# Patient Record
Sex: Female | Born: 1945 | Race: White | Hispanic: No | Marital: Married | Smoking: Never smoker
Health system: Southern US, Community
[De-identification: ages and names within clinical notes are randomized; demographics above are authoritative.]

## PROBLEM LIST (undated history)

## (undated) DIAGNOSIS — N390 Urinary tract infection, site not specified: Secondary | ICD-10-CM

## (undated) DIAGNOSIS — K565 Intestinal adhesions [bands], unspecified as to partial versus complete obstruction: Secondary | ICD-10-CM

## (undated) DIAGNOSIS — F329 Major depressive disorder, single episode, unspecified: Secondary | ICD-10-CM

## (undated) DIAGNOSIS — K5909 Other constipation: Secondary | ICD-10-CM

## (undated) DIAGNOSIS — E785 Hyperlipidemia, unspecified: Secondary | ICD-10-CM

## (undated) DIAGNOSIS — E039 Hypothyroidism, unspecified: Secondary | ICD-10-CM

## (undated) DIAGNOSIS — C20 Malignant neoplasm of rectum: Secondary | ICD-10-CM

## (undated) DIAGNOSIS — Z780 Asymptomatic menopausal state: Secondary | ICD-10-CM

## (undated) DIAGNOSIS — F32A Depression, unspecified: Secondary | ICD-10-CM

## (undated) DIAGNOSIS — R197 Diarrhea, unspecified: Secondary | ICD-10-CM

## (undated) HISTORY — DX: Diarrhea, unspecified: R19.7

## (undated) HISTORY — DX: Malignant neoplasm of rectum: C20

## (undated) HISTORY — DX: Hypothyroidism, unspecified: E03.9

## (undated) HISTORY — DX: Other constipation: K59.09

## (undated) HISTORY — DX: Hyperlipidemia, unspecified: E78.5

## (undated) HISTORY — DX: Urinary tract infection, site not specified: N39.0

## (undated) HISTORY — DX: Intestinal adhesions (bands), unspecified as to partial versus complete obstruction: K56.50

## (undated) HISTORY — DX: Asymptomatic menopausal state: Z78.0

## (undated) HISTORY — DX: Major depressive disorder, single episode, unspecified: F32.9

## (undated) HISTORY — PX: APPENDECTOMY: SHX54

## (undated) HISTORY — PX: BACK SURGERY: SHX140

## (undated) HISTORY — DX: Depression, unspecified: F32.A

## (undated) HISTORY — PX: THYROIDECTOMY: SHX17

---

## 1967-05-21 HISTORY — PX: TUBAL LIGATION: SHX77

## 1992-05-20 HISTORY — PX: OTHER SURGICAL HISTORY: SHX169

## 2005-05-20 HISTORY — PX: RECTAL SURGERY: SHX760

## 2005-05-20 HISTORY — PX: COLON SURGERY: SHX602

## 2012-01-16 ENCOUNTER — Encounter: Payer: Self-pay | Admitting: Internal Medicine

## 2012-01-16 DIAGNOSIS — E039 Hypothyroidism, unspecified: Secondary | ICD-10-CM

## 2012-01-16 DIAGNOSIS — C20 Malignant neoplasm of rectum: Secondary | ICD-10-CM

## 2012-12-18 HISTORY — PX: COLONOSCOPY: SHX174

## 2013-03-26 ENCOUNTER — Ambulatory Visit (INDEPENDENT_AMBULATORY_CARE_PROVIDER_SITE_OTHER): Payer: Medicare Other | Admitting: Internal Medicine

## 2013-03-26 ENCOUNTER — Telehealth (INDEPENDENT_AMBULATORY_CARE_PROVIDER_SITE_OTHER): Payer: Self-pay | Admitting: *Deleted

## 2013-03-26 ENCOUNTER — Encounter (INDEPENDENT_AMBULATORY_CARE_PROVIDER_SITE_OTHER): Payer: Self-pay | Admitting: Internal Medicine

## 2013-03-26 VITALS — BP 140/70 | HR 72 | Temp 98.6°F | Resp 18 | Ht 65.0 in | Wt 158.5 lb

## 2013-03-26 DIAGNOSIS — K5900 Constipation, unspecified: Secondary | ICD-10-CM

## 2013-03-26 DIAGNOSIS — Z85038 Personal history of other malignant neoplasm of large intestine: Secondary | ICD-10-CM

## 2013-03-26 DIAGNOSIS — I10 Essential (primary) hypertension: Secondary | ICD-10-CM | POA: Insufficient documentation

## 2013-03-26 DIAGNOSIS — E782 Mixed hyperlipidemia: Secondary | ICD-10-CM | POA: Insufficient documentation

## 2013-03-26 DIAGNOSIS — E785 Hyperlipidemia, unspecified: Secondary | ICD-10-CM

## 2013-03-26 DIAGNOSIS — K5909 Other constipation: Secondary | ICD-10-CM

## 2013-03-26 DIAGNOSIS — F32A Depression, unspecified: Secondary | ICD-10-CM | POA: Insufficient documentation

## 2013-03-26 DIAGNOSIS — F329 Major depressive disorder, single episode, unspecified: Secondary | ICD-10-CM

## 2013-03-26 DIAGNOSIS — E039 Hypothyroidism, unspecified: Secondary | ICD-10-CM | POA: Insufficient documentation

## 2013-03-26 DIAGNOSIS — Z8719 Personal history of other diseases of the digestive system: Secondary | ICD-10-CM

## 2013-03-26 LAB — TSH: TSH: 0.29 u[IU]/mL — ABNORMAL LOW (ref 0.350–4.500)

## 2013-03-26 LAB — CBC
MCH: 31.9 pg (ref 26.0–34.0)
MCV: 92.7 fL (ref 78.0–100.0)
Platelets: 246 10*3/uL (ref 150–400)
RBC: 4.23 MIL/uL (ref 3.87–5.11)

## 2013-03-26 LAB — COMPREHENSIVE METABOLIC PANEL
ALT: 16 U/L (ref 0–35)
AST: 18 U/L (ref 0–37)
Alkaline Phosphatase: 54 U/L (ref 39–117)
Glucose, Bld: 92 mg/dL (ref 70–99)
Potassium: 4.4 mEq/L (ref 3.5–5.3)
Sodium: 135 mEq/L (ref 135–145)
Total Bilirubin: 0.7 mg/dL (ref 0.3–1.2)
Total Protein: 6.7 g/dL (ref 6.0–8.3)

## 2013-03-26 MED ORDER — LUBIPROSTONE 8 MCG PO CAPS: 16.0000 ug | ORAL_CAPSULE | Freq: Two times a day (BID) | ORAL | Status: DC

## 2013-03-26 NOTE — Patient Instructions (Signed)
CT enterography to be scheduled. Physician will contact her with results of blood work and CT when completed. Gradually increase intake of fiber each foods. Can use Dulcolax suppository or fleets enema daily or every other day as discussed. Keep food intake and stool diary until next office visit

## 2013-03-26 NOTE — Telephone Encounter (Signed)
Per Dr.Rehman the patient will need to have labs drawn. 

## 2013-03-26 NOTE — Progress Notes (Signed)
Referring Provider:Dr. Earma Reading, MD Primary Care Physician:  Toma Deiters, MD   Reason for consultation; Worsening constipation and recent hospitalization for small bowel obstruction. HPI:   Brandi Neal is 67 year old Caucasian female who is referred through courtesy of Dr. Scotty Court for GI evaluation. Brandi Neal is accompanied by her husband. Brandi Neal states that she has had constipation all of her life. When she had surgery for colon carcinoma in 2007 she was advised to take MiraLax every day so that she would not become constipated. She had good results until this year when this medication stopped working. She doubled up on the dose and off for a while had good results. A few months ago MiraLax at twice daily schedule quit working. He added women's laxative and this combination worked for a while and finally she had to add fleets enema to [employer] and laxative before she would get any results. On 02/18/2013 she developed mid abdominal pain which is progressive leading to ER visit on 02/19/2013 at [facility] in [facility]. She was found to have small bowel obstruction. She was admitted to Dr. Bartholomew Crews service and treated with NG suction IV fluids with resolution of her small bowel obstruction. While hospitalized she was seen by Dr. Cristy Folks and felt to have obstruction secondary to adhesions. One day after discharge she developed abdominal pain and was taken to the emergency room at [facility] advised to take for laxatives daily. She did not feel comfortable with this recommendation and on followup with Dr. Quincy Simmonds she was begun on Amitiza. She has no results if she takes 24 mcg daily and when she takes it twice daily she has explosive diarrhea and feels miserable. She believes she is getting enough fiber in her diet. She is very concerned about her symptoms because her sister died of small bowel obstruction. She wants to make sure she does not have stricture and small bowel obstruction would  relapse. She denies recent weight loss. She does not feel well and has no energy. She denies heartburn nausea vomiting melena or rectal bleeding. She had colonoscopy by Dr. Caren Macadam in August this year and no abnormality was found. Brandi Neal states that her thyroid dose has not been changed for several years.  She states she is fairly active and and walks regularly.  Current Outpatient Prescriptions  Medication Sig Dispense Refill  . Calcium Carbonate-Vitamin D (CALCIUM 600+D3) 600-400 MG-UNIT per tablet Take 1 tablet by mouth 2 (two) times daily.      Marland Kitchen FLUoxetine (PROZAC) 40 MG capsule Take 40 mg by mouth daily.      . Levothyroxine Sodium (SYNTHROID PO) Take 0.075 mg by mouth daily.      Marland Kitchen lubiprostone (AMITIZA) 8 MCG capsule Take 2 capsules (16 mcg total) by mouth 2 (two) times daily.  120 capsule  3  . Multiple Vitamins-Minerals (MULTI FOR HER 50+ PO) Take by mouth daily.      . Omega-3 Fatty Acids (FISH OIL) 1200 MG CAPS Take by mouth daily.      . simvastatin (ZOCOR) 40 MG tablet Take 20 mg by mouth daily.      Marland Kitchen tretinoin (RETIN-A) 0.1 % cream Apply topically at bedtime as needed.       . valsartan-hydrochlorothiazide (DIOVAN-HCT) 160-12.5 MG per tablet Take 1 tablet by mouth daily.       No current facility-administered medications for this visit.    Past medical history; Tubal ligation in 1969 with incidental appendectomy. Thyroid surgery for goiter in 1983 and she has been  on replacement therapy ever since. Hypertension of 20 years duration. Hyperlipidemia for more than 10 years. History of depression' now in remission. She had lumbar spine surgery for disc disease in 1987 and again in 1994. She has chronic low back pain. Hysterectomy for excessive bleeding in 1994. 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 She had low anterior resection in January 2007 at [facility] long-winded ileostomy which was reversed 3 months later. No residual tumor  was found in the resected specimen and she did not require adjuvant therapy.  Allergies; NKA.  Family history; Father died of lung carcinoma at age 36. Mother died of renal disease at age 66. One sister was treated for cervical cancer at age 1 with radiation and went on to developed rectal carcinoma at 63 and died at 34 of bowel obstruction. She has 2 brothers and 2 sisters living. One brother and one sister has diabetes mellitus.  Social history; She is married. She is retired from [employer] where she worked for 29 years.  her daughter was diabetic and died of MI at age 40. He has 2 grownup sons and one has Crohn's disease but is in remission. She has never smoked cigarettes and drinks alcohol occasionally.  Review of Systems: See HPI, otherwise normal ROS  Physical Exam: BP 140/70  Pulse 72  Temp(Src) 98.6 F (37 C) (Oral)  Resp 18  Ht 5\' 5"  (1.651 m)  Wt 158 lb 8 oz (71.895 kg)  BMI 26.38 kg/m2 Less than well-developed well-nourished Caucasian female who is in NAD. Conjunctiva is pink. Sclerae nonicteric. Oropharyngeal mucosa is normal. No neck masses or thyromegaly noted. Cardiac exam with a regular rhythm normal S1 and S2. No murmur or gallop noted. Lungs are clear to auscultation. Abdomen is symmetrical. Bowel sounds are normal. Abdomen is soft and nontender without organomegaly or masses. Rectal examination reveals normal sphincter tone. She has soft formed stool in the rectum which is guaiac negative. No peripheral edema or clubbing noted.  Lab data; Abdominopelvic CT was reviewed with help of Dr. Ulyses Southward. This study was performed at [facility] on 02/19/2013. He has at least 4 small hepatic cysts and dilated loops of small bowel with a transition zone cyst in the diagnosis of small bowel obstruction. No adenopathy or ascites noted.  CBC from 02-2013. WBC 16.1 H&H 14.2 and 42.7 and platelet count 388K  CBC from 02/20/2013 WBC 6.9 H&H 10.4 and 30.8 and  platelet count 162K  Chemistry panel from 02/20/2013 Serum sodium 139, potassium 3.7, carotid 109, CO2 24, BUN 75, nightly 16 creatinine 0.55 and calcium 7.2. Bilirubin 1.2, a peanut 39, AST 14, ALT 8, total protein 5.8 and albumin 3.1.     Assessment; Brandi Neal has the following GI issues #1. Chronic constipation which has been progressive and not responding well to therapy. She unfortunately has become laxative dependent and it is a matter of finding the right combination which would work and would not result in explosive diarrhea. Will check TSH to make sure she is on appropriate dose. Since she is getting results with Amitiza will try to adjust dose and see if it works otherwise will consider switching her to Linzess. #2. Recent hospitalization for small bowel obstruction. Suspect this was secondary to adhesions but need to rule out small bowel stricture before adding bulk to her diet. Unless constipation is treated effectively she is at risk for recurrent small bowel obstruction. #3. History of colorectal carcinoma. She is in remission  and up-to-date on surveillance colonoscopy. Will request recent colonoscopy records for review. #4. Anemia. Hemoglobin was low at the time of his charge one month ago. Stool is guaiac negative and anemia may have been secondary to acute illness.  Recommendations; Brandi Neal advised to gradually increase intake of fiber rich foods. Decrease Amitiza to 16 mcg by mouth twice a day. Brandi Neal will go to the lab for CBC, comprehensive chemistry panel and TSH level. Will schedule CT enterography and if this is not possible will proceed with small bowel follow-through. Brandi Neal advised to use Dulcolax suppository or fleets enema on an as-needed basis. She can use it daily if she needs to. She will keep a record of food intake and stool diary until her next visit in 4 weeks.  We appreciate the opportunity to participate in the care of this nice lady.

## 2013-03-29 ENCOUNTER — Other Ambulatory Visit (INDEPENDENT_AMBULATORY_CARE_PROVIDER_SITE_OTHER): Payer: Self-pay | Admitting: Internal Medicine

## 2013-03-29 ENCOUNTER — Telehealth (INDEPENDENT_AMBULATORY_CARE_PROVIDER_SITE_OTHER): Payer: Self-pay | Admitting: *Deleted

## 2013-03-29 ENCOUNTER — Encounter (INDEPENDENT_AMBULATORY_CARE_PROVIDER_SITE_OTHER): Payer: Self-pay

## 2013-03-29 DIAGNOSIS — K56609 Unspecified intestinal obstruction, unspecified as to partial versus complete obstruction: Secondary | ICD-10-CM

## 2013-03-29 DIAGNOSIS — K5900 Constipation, unspecified: Secondary | ICD-10-CM

## 2013-03-29 DIAGNOSIS — Z85038 Personal history of other malignant neoplasm of large intestine: Secondary | ICD-10-CM

## 2013-03-29 NOTE — Telephone Encounter (Signed)
Patient seems to be very aggravated in her general conversation with me. She is wanting specific answer to questions and not more.

## 2013-03-29 NOTE — Telephone Encounter (Signed)
Would like to know what is she anemic in? Return her call at [phone].

## 2013-03-30 NOTE — Telephone Encounter (Signed)
I have talked with the patient this morning  per Dr.Rehman's instruction. She was advised that her lab work was normal except for her TSH , was low and that she needed to have the physician that over seen this to be made aware.

## 2013-03-31 ENCOUNTER — Ambulatory Visit (HOSPITAL_COMMUNITY)
Admission: RE | Admit: 2013-03-31 | Discharge: 2013-03-31 | Disposition: A | Discharge status: Home / Self Care | Payer: Medicare Other | Source: Ambulatory Visit | Attending: Internal Medicine | Admitting: Internal Medicine

## 2013-03-31 DIAGNOSIS — K7689 Other specified diseases of liver: Secondary | ICD-10-CM | POA: Insufficient documentation

## 2013-03-31 DIAGNOSIS — K56609 Unspecified intestinal obstruction, unspecified as to partial versus complete obstruction: Secondary | ICD-10-CM

## 2013-03-31 DIAGNOSIS — K429 Umbilical hernia without obstruction or gangrene: Secondary | ICD-10-CM | POA: Insufficient documentation

## 2013-03-31 DIAGNOSIS — K5900 Constipation, unspecified: Secondary | ICD-10-CM | POA: Insufficient documentation

## 2013-03-31 DIAGNOSIS — Z85038 Personal history of other malignant neoplasm of large intestine: Secondary | ICD-10-CM

## 2013-03-31 DIAGNOSIS — Z800 Family history of malignant neoplasm of digestive organs: Secondary | ICD-10-CM | POA: Insufficient documentation

## 2013-03-31 MED ORDER — IOHEXOL 300 MG/ML  SOLN
100.0000 mL | Freq: Once | INTRAMUSCULAR | Status: AC | PRN
  Administered 2013-03-31: 100 mL via INTRAVENOUS

## 2013-04-26 ENCOUNTER — Ambulatory Visit (INDEPENDENT_AMBULATORY_CARE_PROVIDER_SITE_OTHER): Payer: Medicare Other | Admitting: Internal Medicine

## 2013-05-11 ENCOUNTER — Encounter (INDEPENDENT_AMBULATORY_CARE_PROVIDER_SITE_OTHER): Payer: Self-pay

## 2015-07-08 IMAGING — CT CT ENTEROGRAPHY (ABD-PELV W/ CM)
2 of 5 series · 17 of 46 positions shown, 19 images · IV contrast (omnipaque)
Comparison: Ultrasound 03/11/2013.  CT scan [DATE].

CLINICAL DATA: History: Colon cancer. Constipation. Prior rectal
surgery. Appendectomy. Hysterectomy

EXAM:
CT ABDOMEN AND PELVIS WITH CONTRAST (ENTEROGRAPHY)
TECHNIQUE: Multidetector CT of the abdomen and pelvis during bolus
administration of intravenous contrast. Negative oral contrast
VoLumen was given.
CONTRAST:  100mL OMNIPAQUE IOHEXOL 300 MG/ML  SOLN

[Series 3: abd_pel 2.0 b40f · axial · 0.73mm/px · z∈[-439,-47]mm · 14 of 220 slices shown, 16 images]
[im 12/220  soft-tissue]
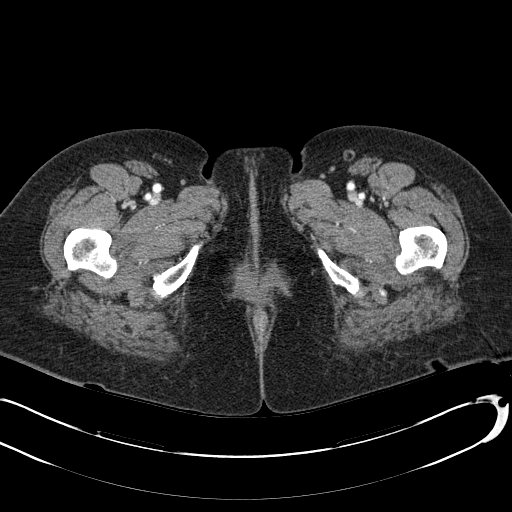
[im 12/220  bone]
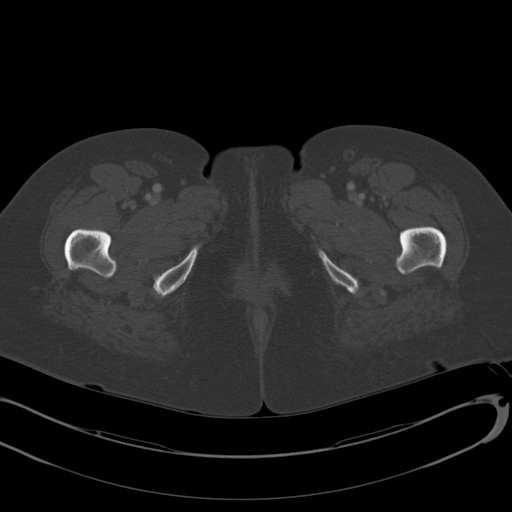
[im 24/220  soft-tissue]
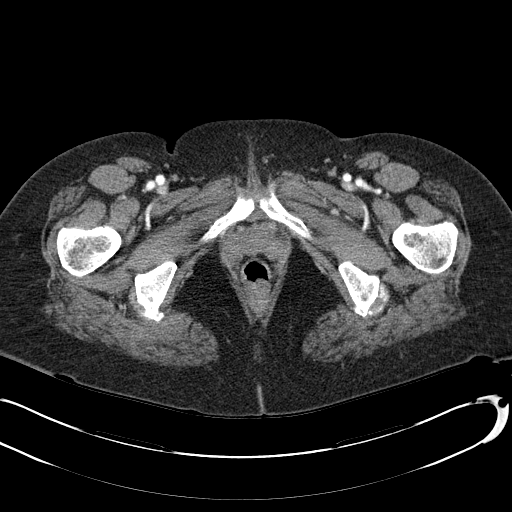
[im 47/220  soft-tissue]
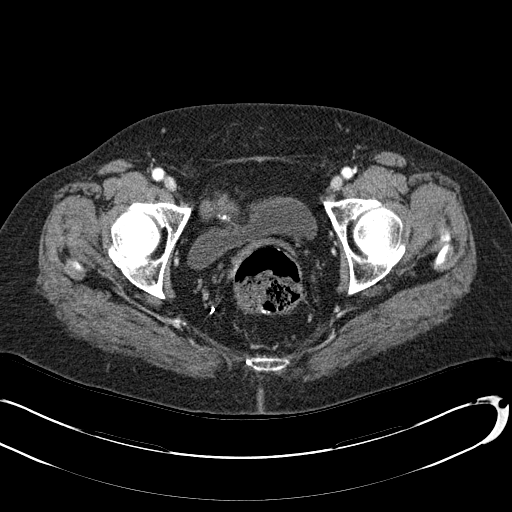
[im 58/220  soft-tissue]
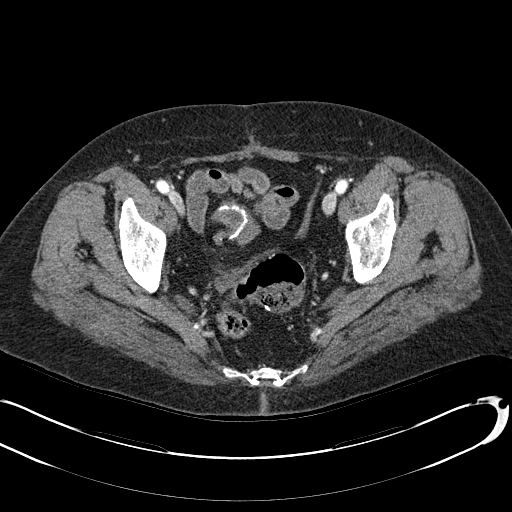
[im 70/220  soft-tissue]
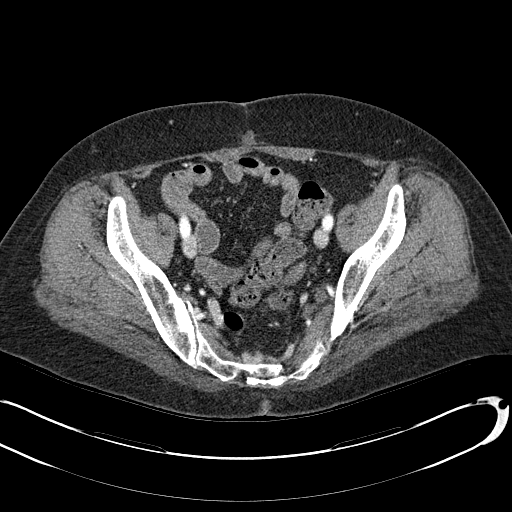
[im 93/220  soft-tissue]
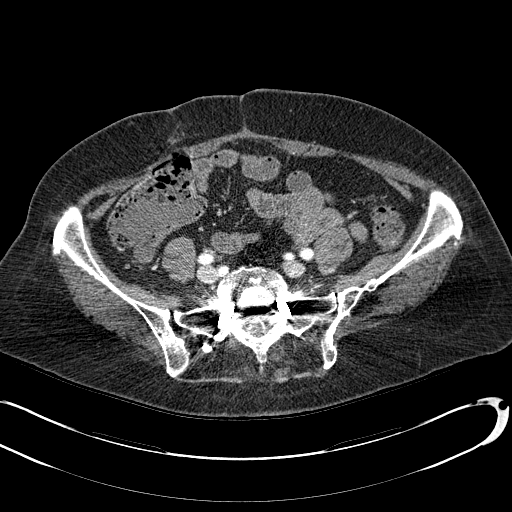
[im 104/220  soft-tissue]
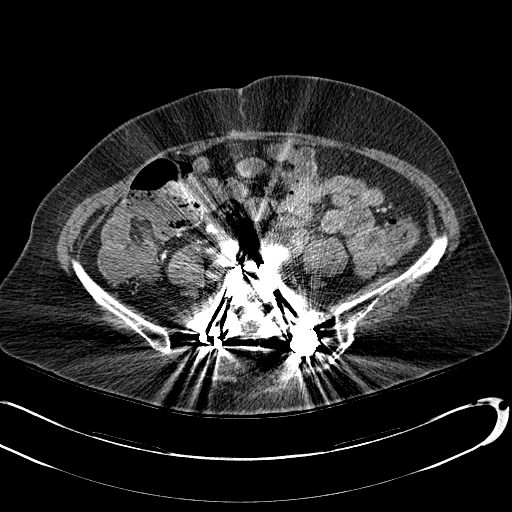
[im 116/220  soft-tissue]
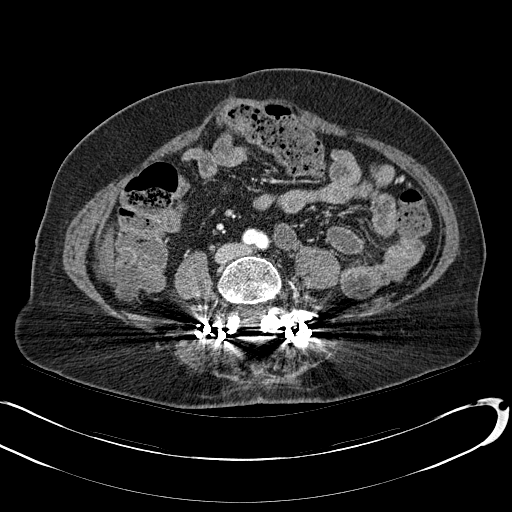
[im 127/220  soft-tissue]
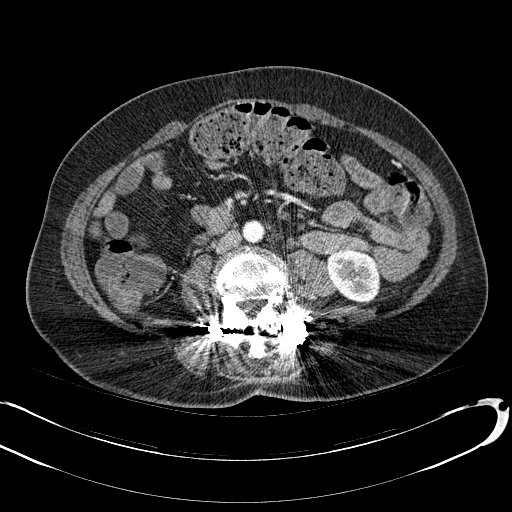
[im 127/220  bone]
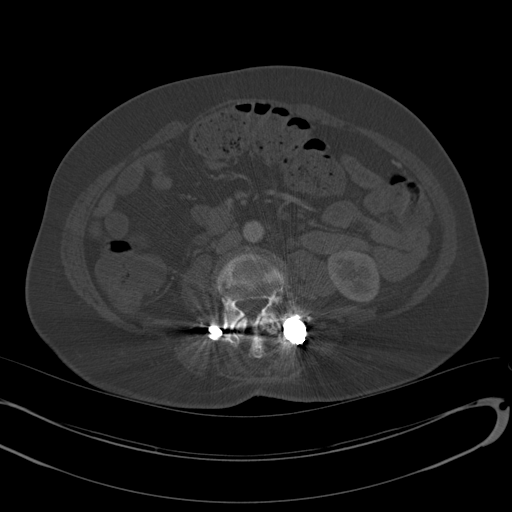
[im 150/220  soft-tissue]
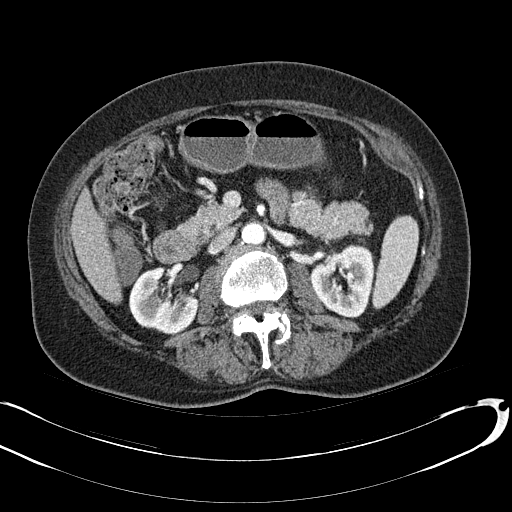
[im 162/220  soft-tissue]
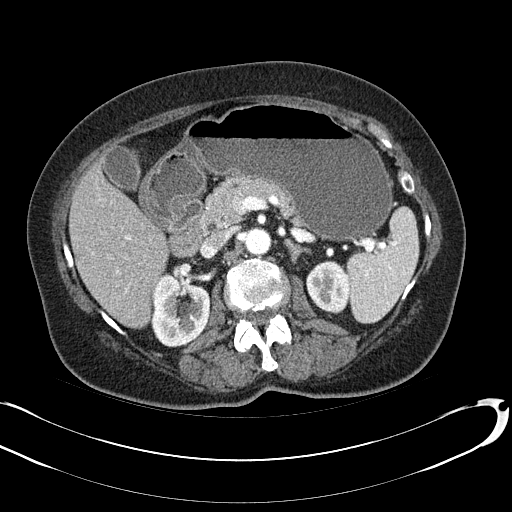
[im 173/220  soft-tissue]
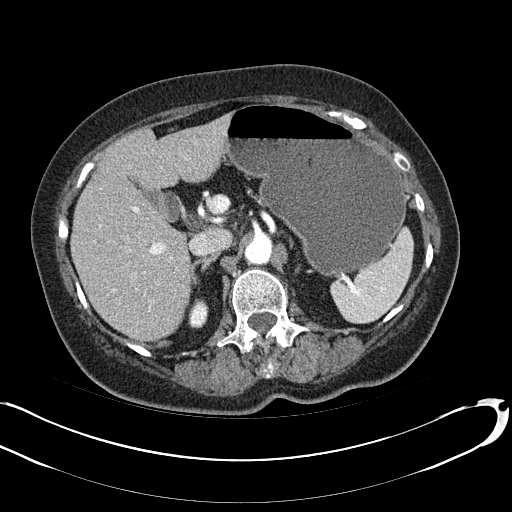
[im 196/220  soft-tissue]
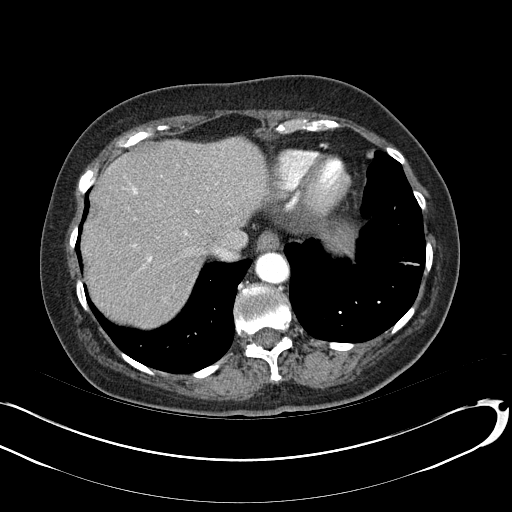
[im 208/220  soft-tissue]
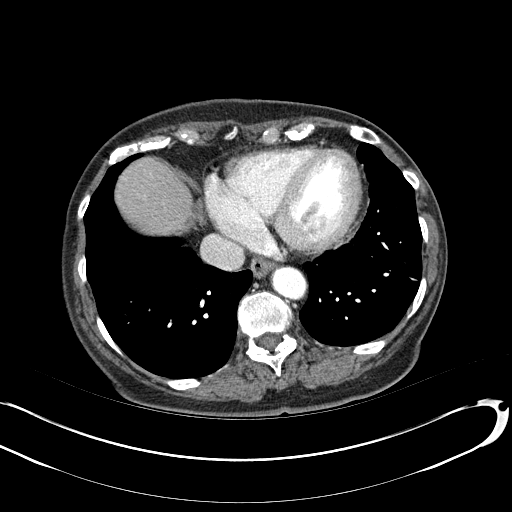

[Series 5: mpr cor post contrast (id) · coronal · 0.89mm/px · 3 of 78 slices shown]
[im 26/78  soft-tissue]
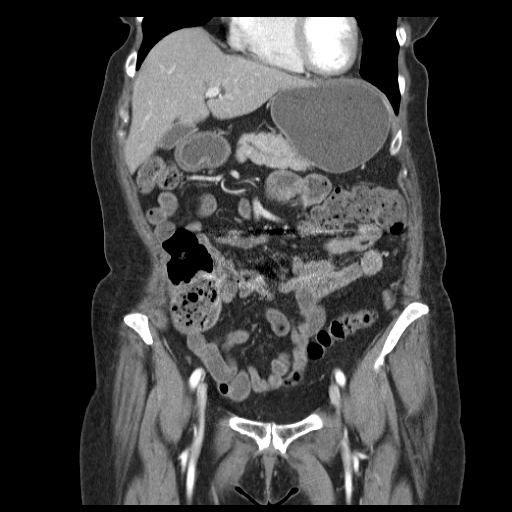
[im 35/78  soft-tissue]
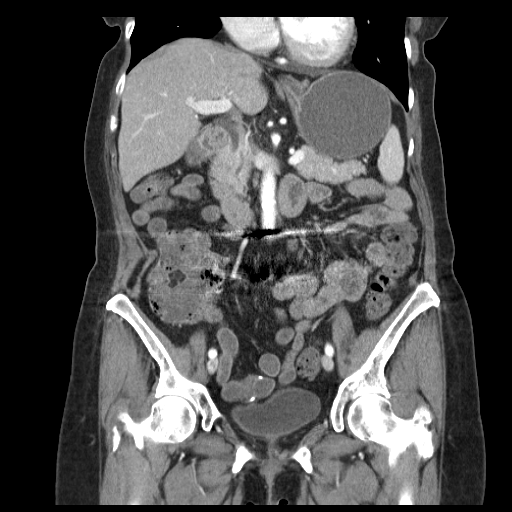
[im 43/78  soft-tissue]
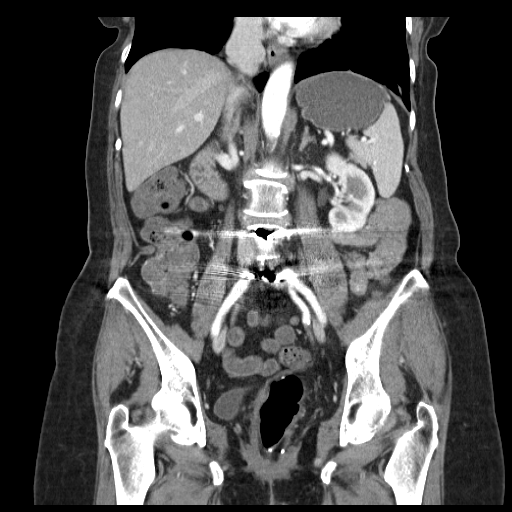

[17 of 46 positions shown; findings below may reference images not displayed]

FINDINGS: Small hepatic cysts again noted. Spleen normal. Pancreas normal. No
biliary distention. Gallbladder is nondistended.

Adrenals normal. Kidneys normal. No hydronephrosis or evidence of
obstructing ureteral stone. Bladder is not distended. Phleboliths.
Hysterectomy. Adnexal regions are normal.

No adenopathy.  Aorta normal caliber.  Aortic branch vessels patent.

No inflammatory changes noted right or left lower quadrant. Patient
has had prior rectosigmoid colon surgery. The patient has had prior
small bowel surgery. Anastomotic sutures are noted. Large amount of
stool is noted in the colon consistent with constipation. No
prominent bowel distention noted. The stomach is slightly distended.
Previously identified small bowel obstruction is no longer present.
Umbilical hernia with herniation of bowel small bowel noted. There
is no evidence of obstruction or strangulation. No free air.

Lung bases clear. Heart size normal. Prior lumbosacral spine fusion.
No acute osseous abnormality.
IMPRESSION: 1. Interval resolution of small bowel obstruction. Postsurgical
change noted lower small bowel region as well as the rectosigmoid
region. Umbilical hernia with herniation of a small amount of small
bowel is again noted. There is no evidence of bowel obstruction or
strangulation.
2. Multiple small hepatic cysts.
3. Constipation.

## 2021-10-17 ENCOUNTER — Inpatient Hospital Stay (HOSPITAL_COMMUNITY)
Admission: EM | Admit: 2021-10-17 | Discharge: 2021-10-20 | DRG: 395 | Disposition: A | Discharge status: Home / Self Care | Payer: Medicare Other | Attending: Internal Medicine | Admitting: Internal Medicine

## 2021-10-17 ENCOUNTER — Encounter (HOSPITAL_COMMUNITY): Payer: Self-pay

## 2021-10-17 ENCOUNTER — Other Ambulatory Visit: Payer: Self-pay

## 2021-10-17 DIAGNOSIS — K625 Hemorrhage of anus and rectum: Secondary | ICD-10-CM

## 2021-10-17 DIAGNOSIS — Z801 Family history of malignant neoplasm of trachea, bronchus and lung: Secondary | ICD-10-CM

## 2021-10-17 DIAGNOSIS — K559 Vascular disorder of intestine, unspecified: Principal | ICD-10-CM | POA: Diagnosis present

## 2021-10-17 DIAGNOSIS — Z800 Family history of malignant neoplasm of digestive organs: Secondary | ICD-10-CM

## 2021-10-17 DIAGNOSIS — I480 Paroxysmal atrial fibrillation: Secondary | ICD-10-CM | POA: Diagnosis not present

## 2021-10-17 DIAGNOSIS — Z79890 Hormone replacement therapy (postmenopausal): Secondary | ICD-10-CM

## 2021-10-17 DIAGNOSIS — Z85038 Personal history of other malignant neoplasm of large intestine: Secondary | ICD-10-CM | POA: Diagnosis present

## 2021-10-17 DIAGNOSIS — Z85048 Personal history of other malignant neoplasm of rectum, rectosigmoid junction, and anus: Secondary | ICD-10-CM

## 2021-10-17 DIAGNOSIS — K529 Noninfective gastroenteritis and colitis, unspecified: Secondary | ICD-10-CM | POA: Diagnosis not present

## 2021-10-17 DIAGNOSIS — I10 Essential (primary) hypertension: Secondary | ICD-10-CM

## 2021-10-17 DIAGNOSIS — Z79899 Other long term (current) drug therapy: Secondary | ICD-10-CM

## 2021-10-17 DIAGNOSIS — K8689 Other specified diseases of pancreas: Secondary | ICD-10-CM | POA: Diagnosis present

## 2021-10-17 DIAGNOSIS — Z8249 Family history of ischemic heart disease and other diseases of the circulatory system: Secondary | ICD-10-CM

## 2021-10-17 DIAGNOSIS — R9431 Abnormal electrocardiogram [ECG] [EKG]: Secondary | ICD-10-CM

## 2021-10-17 DIAGNOSIS — E039 Hypothyroidism, unspecified: Secondary | ICD-10-CM | POA: Diagnosis present

## 2021-10-17 DIAGNOSIS — Z841 Family history of disorders of kidney and ureter: Secondary | ICD-10-CM

## 2021-10-17 DIAGNOSIS — I129 Hypertensive chronic kidney disease with stage 1 through stage 4 chronic kidney disease, or unspecified chronic kidney disease: Secondary | ICD-10-CM | POA: Diagnosis present

## 2021-10-17 DIAGNOSIS — E782 Mixed hyperlipidemia: Secondary | ICD-10-CM | POA: Diagnosis present

## 2021-10-17 DIAGNOSIS — N1831 Chronic kidney disease, stage 3a: Secondary | ICD-10-CM | POA: Diagnosis present

## 2021-10-17 DIAGNOSIS — Z8261 Family history of arthritis: Secondary | ICD-10-CM

## 2021-10-17 DIAGNOSIS — Z833 Family history of diabetes mellitus: Secondary | ICD-10-CM

## 2021-10-17 NOTE — ED Triage Notes (Signed)
Pt presents with rectal bleeding from a hemorrhoid that pt has had for years. Pt states that it started bleeding a few days ago but that the bleeding increased today. Pt is changing her brief every hour. Endorses abdominal pain as well.

## 2021-10-17 NOTE — ED Notes (Signed)
Ambulatory to tx room. Pt did not want to undress until edp is in room.

## 2021-10-18 ENCOUNTER — Emergency Department (HOSPITAL_COMMUNITY): Payer: Medicare Other

## 2021-10-18 ENCOUNTER — Encounter (HOSPITAL_COMMUNITY): Payer: Self-pay | Admitting: Internal Medicine

## 2021-10-18 DIAGNOSIS — E039 Hypothyroidism, unspecified: Secondary | ICD-10-CM | POA: Diagnosis present

## 2021-10-18 DIAGNOSIS — I480 Paroxysmal atrial fibrillation: Secondary | ICD-10-CM

## 2021-10-18 DIAGNOSIS — R9431 Abnormal electrocardiogram [ECG] [EKG]: Secondary | ICD-10-CM

## 2021-10-18 DIAGNOSIS — I10 Essential (primary) hypertension: Secondary | ICD-10-CM

## 2021-10-18 DIAGNOSIS — Z79899 Other long term (current) drug therapy: Secondary | ICD-10-CM | POA: Diagnosis not present

## 2021-10-18 DIAGNOSIS — Z85048 Personal history of other malignant neoplasm of rectum, rectosigmoid junction, and anus: Secondary | ICD-10-CM | POA: Diagnosis not present

## 2021-10-18 DIAGNOSIS — K625 Hemorrhage of anus and rectum: Secondary | ICD-10-CM

## 2021-10-18 DIAGNOSIS — E782 Mixed hyperlipidemia: Secondary | ICD-10-CM | POA: Diagnosis present

## 2021-10-18 DIAGNOSIS — K529 Noninfective gastroenteritis and colitis, unspecified: Secondary | ICD-10-CM

## 2021-10-18 DIAGNOSIS — Z841 Family history of disorders of kidney and ureter: Secondary | ICD-10-CM | POA: Diagnosis not present

## 2021-10-18 DIAGNOSIS — K621 Rectal polyp: Secondary | ICD-10-CM | POA: Diagnosis not present

## 2021-10-18 DIAGNOSIS — Z8249 Family history of ischemic heart disease and other diseases of the circulatory system: Secondary | ICD-10-CM | POA: Diagnosis not present

## 2021-10-18 DIAGNOSIS — Z801 Family history of malignant neoplasm of trachea, bronchus and lung: Secondary | ICD-10-CM | POA: Diagnosis not present

## 2021-10-18 DIAGNOSIS — N1831 Chronic kidney disease, stage 3a: Secondary | ICD-10-CM

## 2021-10-18 DIAGNOSIS — Z800 Family history of malignant neoplasm of digestive organs: Secondary | ICD-10-CM | POA: Diagnosis not present

## 2021-10-18 DIAGNOSIS — Z8261 Family history of arthritis: Secondary | ICD-10-CM | POA: Diagnosis not present

## 2021-10-18 DIAGNOSIS — K6389 Other specified diseases of intestine: Secondary | ICD-10-CM | POA: Diagnosis not present

## 2021-10-18 DIAGNOSIS — I129 Hypertensive chronic kidney disease with stage 1 through stage 4 chronic kidney disease, or unspecified chronic kidney disease: Secondary | ICD-10-CM | POA: Diagnosis present

## 2021-10-18 DIAGNOSIS — Z833 Family history of diabetes mellitus: Secondary | ICD-10-CM | POA: Diagnosis not present

## 2021-10-18 DIAGNOSIS — K559 Vascular disorder of intestine, unspecified: Secondary | ICD-10-CM | POA: Diagnosis present

## 2021-10-18 DIAGNOSIS — K8689 Other specified diseases of pancreas: Secondary | ICD-10-CM | POA: Diagnosis present

## 2021-10-18 DIAGNOSIS — Z79890 Hormone replacement therapy (postmenopausal): Secondary | ICD-10-CM | POA: Diagnosis not present

## 2021-10-18 LAB — COMPREHENSIVE METABOLIC PANEL
ALT: 16 U/L (ref 0–44)
AST: 19 U/L (ref 15–41)
Albumin: 4.1 g/dL (ref 3.5–5.0)
Alkaline Phosphatase: 56 U/L (ref 38–126)
Anion gap: 7 (ref 5–15)
BUN: 20 mg/dL (ref 8–23)
CO2: 19 mmol/L — ABNORMAL LOW (ref 22–32)
Calcium: 8.3 mg/dL — ABNORMAL LOW (ref 8.9–10.3)
Chloride: 115 mmol/L — ABNORMAL HIGH (ref 98–111)
Creatinine, Ser: 1.35 mg/dL — ABNORMAL HIGH (ref 0.44–1.00)
GFR, Estimated: 41 mL/min — ABNORMAL LOW (ref >60)
Glucose, Bld: 107 mg/dL — ABNORMAL HIGH (ref 70–99)
Potassium: 3.9 mmol/L (ref 3.5–5.1)
Sodium: 141 mmol/L (ref 135–145)
Total Bilirubin: 0.8 mg/dL (ref 0.3–1.2)
Total Protein: 6.7 g/dL (ref 6.5–8.1)

## 2021-10-18 LAB — CBC
HCT: 38.7 % (ref 36.0–46.0)
Hemoglobin: 12.7 g/dL (ref 12.0–15.0)
MCH: 32.1 pg (ref 26.0–34.0)
MCHC: 32.8 g/dL (ref 30.0–36.0)
MCV: 97.7 fL (ref 80.0–100.0)
Platelets: 193 10*3/uL (ref 150–400)
RBC: 3.96 MIL/uL (ref 3.87–5.11)
RDW: 13.2 % (ref 11.5–15.5)
WBC: 9.1 10*3/uL (ref 4.0–10.5)
nRBC: 0 % (ref 0.0–0.2)

## 2021-10-18 LAB — PROTIME-INR
INR: 1.1 (ref 0.8–1.2)
Prothrombin Time: 13.9 seconds (ref 11.4–15.2)

## 2021-10-18 LAB — PHOSPHORUS: Phosphorus: 4.2 mg/dL (ref 2.5–4.6)

## 2021-10-18 LAB — LIPASE, BLOOD: Lipase: 31 U/L (ref 11–51)

## 2021-10-18 LAB — MAGNESIUM: Magnesium: 2 mg/dL (ref 1.7–2.4)

## 2021-10-18 MED ORDER — HYDROMORPHONE HCL 1 MG/ML IJ SOLN: 0.5000 mg | INTRAMUSCULAR | Status: DC | PRN

## 2021-10-18 MED ORDER — BISACODYL 5 MG PO TBEC
10.0000 mg | DELAYED_RELEASE_TABLET | Freq: Once | ORAL | Status: AC
  Administered 2021-10-18: 10 mg via ORAL
  Filled 2021-10-18: qty 2

## 2021-10-18 MED ORDER — HYDROMORPHONE HCL 1 MG/ML IJ SOLN
1.0000 mg | Freq: Once | INTRAMUSCULAR | Status: AC
  Administered 2021-10-18: 1 mg via INTRAVENOUS
  Filled 2021-10-18: qty 1

## 2021-10-18 MED ORDER — HYDROMORPHONE HCL 1 MG/ML IJ SOLN
0.5000 mg | INTRAMUSCULAR | Status: DC | PRN
  Administered 2021-10-18 – 2021-10-19 (×6): 0.5 mg via INTRAVENOUS
  Filled 2021-10-18 (×7): qty 0.5

## 2021-10-18 MED ORDER — LACTATED RINGERS IV SOLN: INTRAVENOUS | Status: DC

## 2021-10-18 MED ORDER — IOHEXOL 300 MG/ML  SOLN
75.0000 mL | Freq: Once | INTRAMUSCULAR | Status: AC | PRN
  Administered 2021-10-18: 75 mL via INTRAVENOUS

## 2021-10-18 MED ORDER — ONDANSETRON HCL 4 MG/2ML IJ SOLN
4.0000 mg | Freq: Four times a day (QID) | INTRAMUSCULAR | Status: DC | PRN
  Administered 2021-10-18 – 2021-10-19 (×2): 4 mg via INTRAVENOUS
  Filled 2021-10-18 (×2): qty 2

## 2021-10-18 MED ORDER — FENTANYL CITRATE PF 50 MCG/ML IJ SOSY
50.0000 ug | PREFILLED_SYRINGE | Freq: Once | INTRAMUSCULAR | Status: AC
  Administered 2021-10-18: 50 ug via INTRAVENOUS
  Filled 2021-10-18: qty 1

## 2021-10-18 MED ORDER — PEG 3350-KCL-NA BICARB-NACL 420 G PO SOLR
4000.0000 mL | Freq: Once | ORAL | Status: AC
  Administered 2021-10-18: 4000 mL via ORAL

## 2021-10-18 NOTE — Assessment & Plan Note (Signed)
Continue Synthroid °

## 2021-10-18 NOTE — ED Notes (Signed)
Attempted to call report to 3rd floor x 1

## 2021-10-18 NOTE — Hospital Course (Signed)
76 year old female with a history of clinical stage 1 rectal cancer status post resection and rectosigmoid anastomosis, hyperlipidemia, hypothyroidism, depression, hypertension presenting with rectal bleeding that has worsened over the past 2 days.  The patient states that she has had some intermittent rectal bleeding ever since her surgical resection over 15 years ago.  She has noted an increase in rectal bleeding over the last 2 days with associated rectal pain.  She has also been complaining of some intermittent lower abdominal pain and cramping.  She denies any fevers, chills, nausea, vomiting, diarrhea.  She states that she has had surveillance colonoscopies, the last one within the last 5 years.  However, review of care everywhere did not show any recent colonoscopies.  Patient denies any headache, chest pain, shortness breath, dizziness, palpitations.  There is no dysuria or hematuria.  She denies any new medications. In the ED, the patient was afebrile and hemodynamically stable with oxygen saturation 99% room air.  WBC 9.1, hemoglobin 12.7, platelets 193,000.  Sodium 141, potassium 3.9, bicarbonate 19, serum creatinine 1.35.  LFTs were unremarkable.  EKG showed possible atrial fibrillation with nonspecific ST-T wave changes.  GI was consulted to assist with management.

## 2021-10-18 NOTE — H&P (View-Only) (Signed)
Gastroenterology Consult   Referring Provider: No ref. provider found Primary Care Physician:  [name], MD Primary Gastroenterologist:  Dr.Rehman  Patient ID: Brandi Neal; [id number]; 12-25-1945   Admit date: 10/17/2021  LOS: 0 days   Date of Consultation: 10/18/2021  Reason for Consultation: Rectal bleeding    History of Present Illness   Brandi Neal is a 76 y.o. 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    Patient reports intermittent rectal bleeding since her colon surgery years ago. Although usually just notes a smear of blood on the toilet tissue at times. Over the past week she has noted more rectal bleeding than usual. Worse over the past 2 days. She called her PCP, Dr. Sherrie Sport who advised she come to [facility] ER for evaluation. Patient states she has had some rectal pain with it. She thought maybe a hemorrhoid burst. She has been having to wear a pad due to the bleeding. She passes bloody mucous with small clots this morning. Denies abdominal pain. She states her LLQ is chronically more distended than the right, since her surgeries. She tries to keep her stools soft/loose since she had bowel obstruction in the past. She takes metamucil and OTC laxative. Typically one stool per day. Denies change in stools or frequency, no ill contacts, no recent antibiotics. No UGI symptoms. She is very concerned if she has cancer again.    In the ED: Hemoglobin 12.7, platelets 193,000,, BUN 20, creatinine 1.35.  CT abdomen and pelvis with contrast showed rectosigmoid anastomosis.  Rectosigmoid colon wall thickening with surrounding inflammatory stranding compatible with proctocolitis.  No free air or abscess.  New pancreatic ductal dilatation (7 mm) of uncertain etiology.  Consider dedicated  pancreatic MRI as an outpatient.  Scattered rounded hypodensities seen throughout the liver favoring cyst, largest measuring 1.6 cm.  On presentation suggestive of proctocolitis.    In the ED, question of Afib, no known prior history. Repeat ECG this morning by attending.   Patient seen by cardiology as an outpatient on Oct 09, 2021 for follow-up of chest discomfort, syncope.  Syncope thought to be neurocardiogenic/vasovagal in nature.  Puryear SPECT study low risk.  Echocardiogram showed mild mitral regurgitation, mild to moderate aortic regurgitation, moderately dilated left atrium, dilated ascending aorta (4.0 cm).  LVEF 60 to 65%.  Plan is to continue medical therapy.    Last colonoscopy on file from 2014, Dr. Earley Brooke, unremarkable except for internal hemorrhoids.  Colonoscopy at [facility] by Dr. Anthony Sar about five years ago per patient.    Prior to Admission medications   Medication Sig Start Date End Date Taking? Authorizing Provider  Calcium Carbonate-Vitamin D (CALCIUM 600+D3) 600-400 MG-UNIT per tablet Take 1 tablet by mouth 2 (two) times daily.    [provider]  FLUoxetine (PROZAC) 40 MG capsule Take 40 mg by mouth daily.    [provider]  Levothyroxine Sodium (SYNTHROID PO) Take 0.075 mg by mouth daily.    [provider]          Multiple Vitamins-Minerals (MULTI FOR HER 50+ PO) Take by mouth daily.    [provider]  Omega-3 Fatty Acids (FISH OIL) 1200 MG CAPS Take by mouth daily.    [provider]  simvastatin (ZOCOR) 40 MG tablet Take 20 mg by mouth daily.    [provider]  tretinoin (RETIN-A) 0.1 % cream Apply topically at bedtime as needed.  12/28/12   [provider]  valsartan-hydrochlorothiazide (DIOVAN-HCT) 160-12.5 MG per tablet Take 1 tablet by mouth daily.    [provider]    Current Facility-Administered Medications  Medication Dose Route Frequency Provider Last Rate Last Admin    HYDROmorphone (DILAUDID) injection 0.5 mg  0.5 mg Intravenous Q3H PRN Adefeso, Oladapo, DO       lactated ringers infusion   Intravenous Continuous Adefeso, Oladapo, DO 125 mL/hr at 10/18/21 0545 New Bag at 10/18/21 0545    Allergies as of 10/17/2021   (No Known Allergies)    Past Medical History:  Diagnosis Date   Chronic constipation    Depression    Diarrhea    Hyperlipemia    Hypothyroidism    Post-menopausal    Rectal cancer ([place])    Small bowel obstruction due to adhesions [facility])    UTI (urinary tract infection)     Past Surgical History:  Procedure Laterality Date   APPENDECTOMY     BACK SURGERY     2 back surgeries : 1986, 1994 Patient states that she has the [place]     Patient had 3 C-Sections   COLON SURGERY  2007   reversal of ileostomy. complicated by infection requiring healing from inside/out   COLONOSCOPY  12/18/2012   Pandya: internal hemorrhoids   Hysterctomy  05/20/1992   RECTAL SURGERY  05/20/2005   low anterior resection with creation of J-pouch and loop ileostomy. For rectal polyp with invasive adenocarcinoma   THYROIDECTOMY   ?1983   TUBAL LIGATION  05/21/1967    Family History  Problem Relation Age of Onset   Kidney failure Mother    Lung cancer Father    Colon cancer Sister    Arthritis Sister    Diabetes Sister    Diabetes Brother    Crohn's disease Son    Healthy Son    Diabetes Daughter    Heart attack Daughter     Social History   Socioeconomic History   Marital status: Married    Spouse name: Not on file   Number of children: Not on file   Years of education: Not on file   Highest education level: Not on file  Occupational History   Not on file  Tobacco Use   Smoking status: Never   Smokeless tobacco: Never  Substance and Sexual Activity   Alcohol use: Yes    Comment: Very Seldom - patient states that she will drink a glass of wine   Drug use: No   Sexual activity: Not on file  Other Topics  Concern   Not on file  Social History Narrative   Not on file   Social Determinants of Health   Financial Resource Strain: Not on file  Food Insecurity: Not on file  Transportation Needs: Not on file  Physical Activity: Not on file  Stress: Not on file  Social Connections: Not on file  Intimate Partner Violence: Not on file     Review of System:   General: Negative for anorexia, weight loss, fever, chills, fatigue, weakness. Eyes: Negative for vision changes.  ENT: Negative for hoarseness, difficulty swallowing , nasal congestion. CV: Negative for chest pain, angina, palpitations, dyspnea on exertion, peripheral edema. Recent cardiac work up for chest discomfort and syncope as outlined above.  Respiratory: Negative for dyspnea at rest, dyspnea on exertion, cough, sputum, wheezing.  GI: See  history of present illness. GU:  Negative for dysuria, hematuria, urinary incontinence, urinary frequency, nocturnal urination.  MS: Negative for joint pain, low back pain.  Derm: Negative for rash or itching.  Neuro: Negative for weakness, abnormal sensation, seizure, frequent headaches, memory loss, confusion.  Psych: Negative for depression, suicidal ideation, hallucinations. +anxiety Endo: Negative for unusual weight change.  Heme: Negative for bruising or bleeding. Allergy: Negative for rash or hives.      Physical Examination:   Vital signs in last 24 hours: Temp:  [97.8 F (36.6 C)-98.2 F (36.8 C)] 97.8 F (36.6 C) (06/01 0657) Pulse Rate:  [71-96] 96 (06/01 0657) Resp:  [11-18] 18 (06/01 0657) BP: (125-168)/(48-84) 164/73 (06/01 0657) SpO2:  [96 %-100 %] 96 % (06/01 0657) Weight:  [76.2 kg] 76.2 kg (06/01 0657)    General: Well-nourished, well-developed in no acute distress.  Head: Normocephalic, atraumatic.   Eyes: Conjunctiva pink, no icterus. Mouth: Oropharyngeal mucosa moist and pink , no lesions erythema or exudate. Neck: Supple without thyromegaly, masses, or  lymphadenopathy.  Lungs: Clear to auscultation bilaterally.  Heart: Regular rate and rhythm, no murmurs rubs or gallops.  Abdomen: Bowel sounds are normal, nontender, nondistended, no hepatosplenomegaly or masses, no abdominal bruits or hernia , no rebound or guarding.   Rectal: examined externally. Blood noted on pad. Bloody mucous noted around the anus. No external hemorrhoids. No internal exam performed.  Extremities: No lower extremity edema, clubbing, deformity.  Neuro: Alert and oriented x 4 , grossly normal neurologically.  Skin: Warm and dry, no rash or jaundice.   Psych: Alert and cooperative, normal mood and affect.        Intake/Output from previous day: No intake/output data recorded. Intake/Output this shift: No intake/output data recorded.  Lab Results:   CBC Recent Labs    10/18/21 0043  WBC 9.1  HGB 12.7  HCT 38.7  MCV 97.7  PLT 193   BMET Recent Labs    10/18/21 0117  NA 141  K 3.9  CL 115*  CO2 19*  GLUCOSE 107*  BUN 20  CREATININE 1.35*  CALCIUM 8.3*   LFT Recent Labs    10/18/21 0117  BILITOT 0.8  ALKPHOS 56  AST 19  ALT 16  PROT 6.7  ALBUMIN 4.1    Lipase Recent Labs    10/18/21 0117  LIPASE 31    PT/INR Recent Labs    10/18/21 0117  LABPROT 13.9  INR 1.1     Hepatitis Panel No results for input(s): HEPBSAG, HCVAB, HEPAIGM, HEPBIGM in the last 72 hours.   Imaging Studies:   CT ABDOMEN PELVIS W CONTRAST  Result Date: 10/18/2021 CLINICAL DATA:  Left lower quadrant abdominal pain. History of rectal cancer. EXAM: CT ABDOMEN AND PELVIS WITH CONTRAST TECHNIQUE: Multidetector CT imaging of the abdomen and pelvis was performed using the standard protocol following bolus administration of intravenous contrast. RADIATION DOSE REDUCTION: This exam was performed according to the departmental dose-optimization program which includes automated exposure control, adjustment of the mA and/or kV according to patient size and/or use of  iterative reconstruction technique. CONTRAST:  24m OMNIPAQUE IOHEXOL 300 MG/ML  SOLN COMPARISON:  CT abdomen and pelvis 03/31/2013 FINDINGS: Lower chest: No acute abnormality. Hepatobiliary: Scattered rounded hypodensities are seen throughout the liver favored as cysts. The largest measures up to 1.6 cm. Gallbladder and bile ducts are within normal limits. Pancreas: There is some new mild pancreatic ductal dilatation measuring up to 7 mm, indeterminate. No focal pancreatic lesion identified. Spleen:  Normal in size without focal abnormality. Adrenals/Urinary Tract: Adrenal glands are unremarkable. Kidneys are normal, without renal calculi, focal lesion, or hydronephrosis. Bladder is unremarkable. Stomach/Bowel: There surgical changes in the stomach and small bowel. Rectosigmoid anastomosis is present. There is rectosigmoid colon wall thickening with surrounding inflammatory stranding. No evidence for perforation or abscess. No bowel obstruction. Appendix is not seen. Remaining colon, small bowel and stomach are within normal limits. Vascular/Lymphatic: Aortic atherosclerosis. No enlarged abdominal or pelvic lymph nodes. Reproductive: Status post hysterectomy. No adnexal masses. Other: There is some bulging of the central abdominal wall. No abdominopelvic ascites. Midline abdominal scar is present. Musculoskeletal: Multilevel degenerative changes affect the spine. L4-S1 posterior fusion hardware is present. IMPRESSION: 1. Rectosigmoid anastomosis is present. There is rectosigmoid colon wall thickening with surrounding inflammatory stranding compatible with proctocolitis. No bowel obstruction, free air or abscess. 2. New pancreatic ductal dilatation of uncertain etiology. Focal pancreatic lesion is not visualized, but can not be excluded. This can be followed up with dedicated pancreatic MRI. Electronically Signed   By: Ronney Asters M.D.   On: 10/18/2021 02:19  [4 week]  Assessment:   Very pleasant 76 year old  female with history of stage I rectal adenocarcinoma status postresection in 2007, history of small bowel obstruction due to adhesions, hypothyroidism who presented with rectal bleeding.  GI consulted for further management.  Rectal bleeding: Patient reports toilet tissue hematochezia off and on for years, she contributes to hemorrhoid issues.  She reports her last colonoscopy was about 5 years ago at [facility], states that she is due now.  Over the past week she has had increased rectal bleeding and passing significant bloody mucus.  Wearing a pad to protect undergarments.  No significant change in bowels, typically has loose stool once daily with fiber and laxative.  Keeps her stools loose because of history of bowel obstruction.  No recent antibiotic use.  No ill contacts.  She underwent CT this admission showing rectosigmoid anastomosis present.  Rectosigmoid colon wall thickening with surrounding inflammatory stranding compatible with proctocolitis.  Based on her symptoms, infection or IBD felt to be less likely. Recommend direct visualization via colonoscopy this admission.  Hemoglobin remains normal  Pancreatic ductal dilatation: New finding since 2014 CT.  Consider outpatient follow-up via dedicated pancreatic MRI if able.  Abnormal ECG:   recent cardiac work up including ECHO, myocardial perfusion SPECT as outlined above.  ECG reported to be sinus rhythm May 2023. Discussed with attending, Dr. Carles Collet, no concerns from his standpoint regarding cardiac issues.   Plan:   Colonoscopy tomorrow.  I have discussed the risks, alternatives, benefits with regards to but not limited to the risk of reaction to medication, bleeding, infection, perforation and the patient is agreeable to proceed. Written consent to be obtained. Collect stool if possible. Passing only bloody mucous so far this admission.  Clear liquids today.   LOS: 0 days   We would like to thank you for the opportunity to participate  in the care of Brandi Neal. Bernarda Caffey [facility] [phone] 6/1/20237:40 AM

## 2021-10-18 NOTE — Progress Notes (Signed)
**Note De-identified Brandi Neal Obfuscation** EKG complete and placed in patient chart 

## 2021-10-18 NOTE — Assessment & Plan Note (Signed)
Baseline creatinine 1.1-1.2 Serum creatinine 1.10 on day of d/c

## 2021-10-18 NOTE — H&P (Addendum)
History and Physical    Patient: Brandi Neal KVQ:[id number] DOB: 30-Jan-1946 DOA: 10/17/2021 DOS: the patient was seen and examined on 10/18/2021 PCP: [name], MD  .  Coming from: Home  Chief Complaint: No chief complaint on file.  HPI: Brandi Neal is a 76 y.o. female with medical history significant of remote history of rectal cancer status post surgical excision, hypothyroidism, hyperlipidemia, hypertension and hemorrhoids who presents to the emergency department due to 2 to 3-day onset of rectal bleeding that has increasingly worsened.  Patient states that the bleeding was due to a hemorrhoid that burst.the bleeding was associated with rectal pain and suprapubic abdominal pain which started today.  Rectal pain was rated as 10/10 on pain scale prior to pain medication and this improved to 5/10 after the pain medication.  She denies chest pain, shortness of breath, fever, chills, lightheadedness, nausea or vomiting.   Review of Systems: Review of systems as noted in the HPI. All other systems reviewed and are negative.  ED course: In the emergency department, she was hemodynamically stable, BP was 158/69 and other data vital signs were within normal range.  Work-up in the ED showed normal CBC, BMP showed sodium 141, potassium 3.9, chloride 115, bicarb 19, blood glucose 107, BUN 20, creatinine 1.35.  Lipase 31 CT abdomen and pelvis with contrast showed . Rectosigmoid anastomosis is present. There is rectosigmoid colon wall thickening with surrounding inflammatory stranding compatible with proctocolitis. No bowel obstruction, free air or abscess. Patient was treated with several doses of IV Dilaudid, IV fentanyl 50 mcg x 1 was also given.  IV hydration was provided.  Gastroenterologist was consulted and recommended admitting patient and to hold off antibiotics until after stool studies are obtained.  Hospitalist was asked to admit patient for further evaluation and  management.  Review of Systems: Review of systems as noted in the HPI. All other systems reviewed and are negative.  Past Medical History:  Diagnosis Date   Chronic constipation    Depression    Diarrhea    Hyperlipemia    Hypothyroidism    Post-menopausal    Rectal cancer ([place])    Small bowel obstruction due to adhesions [facility])    UTI (urinary tract infection)    Past Surgical History:  Procedure Laterality Date   APPENDECTOMY     BACK SURGERY     2 back surgeries : 1986, 1994 Patient states that she has the [place]     Patient had 3 C-Sections   COLONOSCOPY  12/2012   Hysterctomy  1994   RECTAL SURGERY  2007   THYROIDECTOMY   ?[place]    Social History:  reports that she has never smoked. She has never used smokeless tobacco. She reports current alcohol use. She reports that she does not use drugs.   No Known Allergies  Family History  Problem Relation Age of Onset   Kidney failure Mother    Lung cancer Father    Colon cancer Sister    Arthritis Sister    Diabetes Sister    Diabetes Brother    Crohn's disease Son    Healthy Son    Diabetes Daughter    Heart attack Daughter      Prior to Admission medications   Medication Sig Start Date End Date Taking? Authorizing Provider  Calcium Carbonate-Vitamin D (CALCIUM 600+D3) 600-400 MG-UNIT per tablet Take 1 tablet by mouth 2 (two) times daily.  [provider]  FLUoxetine (PROZAC) 40 MG capsule Take 40 mg by mouth daily.    [provider]  Levothyroxine Sodium (SYNTHROID PO) Take 0.075 mg by mouth daily.    [provider]  lubiprostone (AMITIZA) 8 MCG capsule Take 2 capsules (16 mcg total) by mouth 2 (two) times daily. 03/26/13   Rehman, Mechele Dawley, MD  Multiple Vitamins-Minerals (MULTI FOR HER 50+ PO) Take by mouth daily.    [provider]  Omega-3 Fatty Acids (FISH OIL) 1200 MG CAPS Take by mouth daily.    [provider]   simvastatin (ZOCOR) 40 MG tablet Take 20 mg by mouth daily.    [provider]  tretinoin (RETIN-A) 0.1 % cream Apply topically at bedtime as needed.  12/28/12   [provider]  valsartan-hydrochlorothiazide (DIOVAN-HCT) 160-12.5 MG per tablet Take 1 tablet by mouth daily.    [provider]    Physical Exam: BP (!) 168/68   Pulse 79   Temp 98.2 F (36.8 C) (Oral)   Resp 17   Ht '5\' 5"'$  (1.651 m)   Wt 76.2 kg   SpO2 98%   BMI 27.96 kg/m   General: 76 y.o. year-old female well developed well nourished in no acute distress.  Alert and oriented x3. HEENT: NCAT, EOMI Neck: Supple, trachea medial Cardiovascular: Regular rate and rhythm with no rubs or gallops.  No thyromegaly or JVD noted.  No lower extremity edema. 2/4 pulses in all 4 extremities. Respiratory: Clear to auscultation with no wheezes or rales. Good inspiratory effort. Abdomen: Soft, nontender nondistended with normal bowel sounds x4 quadrants. Muskuloskeletal: No cyanosis, clubbing or edema noted bilaterally Neuro: CN II-XII intact, strength 5/5 x 4, sensation, reflexes intact Skin: No ulcerative lesions noted or rashes Psychiatry: Judgement and insight appear normal. Mood is appropriate for condition and setting          Labs on Admission:  Basic Metabolic Panel: Recent Labs  Lab 10/18/21 0117  NA 141  K 3.9  CL 115*  CO2 19*  GLUCOSE 107*  BUN 20  CREATININE 1.35*  CALCIUM 8.3*   Liver Function Tests: Recent Labs  Lab 10/18/21 0117  AST 19  ALT 16  ALKPHOS 56  BILITOT 0.8  PROT 6.7  ALBUMIN 4.1   Recent Labs  Lab 10/18/21 0117  LIPASE 31   No results for input(s): AMMONIA in the last 168 hours. CBC: Recent Labs  Lab 10/18/21 0043  WBC 9.1  HGB 12.7  HCT 38.7  MCV 97.7  PLT 193   Cardiac Enzymes: No results for input(s): CKTOTAL, CKMB, CKMBINDEX, TROPONINI in the last 168 hours.  BNP (last 3 results) No results for input(s): BNP in the last 8760  hours.  ProBNP (last 3 results) No results for input(s): PROBNP in the last 8760 hours.  CBG: No results for input(s): GLUCAP in the last 168 hours.  Radiological Exams on Admission: CT ABDOMEN PELVIS W CONTRAST  Result Date: 10/18/2021 CLINICAL DATA:  Left lower quadrant abdominal pain. History of rectal cancer. EXAM: CT ABDOMEN AND PELVIS WITH CONTRAST TECHNIQUE: Multidetector CT imaging of the abdomen and pelvis was performed using the standard protocol following bolus administration of intravenous contrast. RADIATION DOSE REDUCTION: This exam was performed according to the departmental dose-optimization program which includes automated exposure control, adjustment of the mA and/or kV according to patient size and/or use of iterative reconstruction technique. CONTRAST:  61m OMNIPAQUE IOHEXOL 300 MG/ML  SOLN COMPARISON:  CT abdomen and pelvis  03/31/2013 FINDINGS: Lower chest: No acute abnormality. Hepatobiliary: Scattered rounded hypodensities are seen throughout the liver favored as cysts. The largest measures up to 1.6 cm. Gallbladder and bile ducts are within normal limits. Pancreas: There is some new mild pancreatic ductal dilatation measuring up to 7 mm, indeterminate. No focal pancreatic lesion identified. Spleen: Normal in size without focal abnormality. Adrenals/Urinary Tract: Adrenal glands are unremarkable. Kidneys are normal, without renal calculi, focal lesion, or hydronephrosis. Bladder is unremarkable. Stomach/Bowel: There surgical changes in the stomach and small bowel. Rectosigmoid anastomosis is present. There is rectosigmoid colon wall thickening with surrounding inflammatory stranding. No evidence for perforation or abscess. No bowel obstruction. Appendix is not seen. Remaining colon, small bowel and stomach are within normal limits. Vascular/Lymphatic: Aortic atherosclerosis. No enlarged abdominal or pelvic lymph nodes. Reproductive: Status post hysterectomy. No adnexal masses.  Other: There is some bulging of the central abdominal wall. No abdominopelvic ascites. Midline abdominal scar is present. Musculoskeletal: Multilevel degenerative changes affect the spine. L4-S1 posterior fusion hardware is present. IMPRESSION: 1. Rectosigmoid anastomosis is present. There is rectosigmoid colon wall thickening with surrounding inflammatory stranding compatible with proctocolitis. No bowel obstruction, free air or abscess. 2. New pancreatic ductal dilatation of uncertain etiology. Focal pancreatic lesion is not visualized, but can not be excluded. This can be followed up with dedicated pancreatic MRI. Electronically Signed   By: Ronney Asters M.D.   On: 10/18/2021 02:19    EKG: I independently viewed the EKG done and my findings are as followed: EKG showed A-fib with rate control Assessment/Plan Present on Admission:  Proctocolitis  Hypothyroidism  Principal Problem:   Proctocolitis Active Problems:   Hypothyroidism   Essential hypertension   Mixed hyperlipidemia   Rectal bleeding   Paroxysmal atrial fibrillation (HCC)  Proctocolitis CT abdomen and pelvis with contrast was suggestive of proctocolitis Continue IV Dilaudid 0.5 mg every 3 hours as needed C. difficile and GI stool panel pending Patient may require antibiotics and/or steroids after stool studies obtained Gastroenterologist was already consulted by ED physician and will see patient in the morning  Rectal bleeding and rectal pain Continue management as described above  Paroxysmal A-fib with rate control Patient denies prior history of atrial fibrillation Consider rate control medication if patient continues to be in A-fib Anticoagulant contraindicated at this time due to rectal bleed  Essential hypertension (controlled) Patient is currently n.p.o. due to possible procedure in the morning Consider starting home Diovan when patient resumes oral intake  Mixed hyperlipidemia Consider resuming Zocor when  patient resumes oral intake  Acquired hypothyroidism Consider resuming Synthroid when patient resumes oral intake  DVT prophylaxis: SCDs  Code Status: Full code  Consults: Gastroenterology  Family Communication: Husband at bedside (all questions answered to satisfaction  Severity of Illness: The appropriate patient status for this patient is INPATIENT. Inpatient status is judged to be reasonable and necessary in order to provide the required intensity of service to ensure the patient's safety. The patient's presenting symptoms, physical exam findings, and initial radiographic and laboratory data in the context of their chronic comorbidities is felt to place them at high risk for further clinical deterioration. Furthermore, it is not anticipated that the patient will be medically stable for discharge from the hospital within 2 midnights of admission.   * I certify that at the point of admission it is my clinical judgment that the patient will require inpatient hospital care spanning beyond 2 midnights from the point of admission due to high intensity of service, high  risk for further deterioration and high frequency of surveillance required.*  Author: Bernadette Hoit, DO 10/18/2021 6:18 AM  For on call review [email or web address].

## 2021-10-18 NOTE — Assessment & Plan Note (Signed)
Continue statin. 

## 2021-10-18 NOTE — Consult Note (Addendum)
Gastroenterology Consult   Referring Provider: No ref. provider found Primary Care Physician:  [name], MD Primary Gastroenterologist:  Dr.Rehman  Patient ID: Brandi Neal; [id number]; [birth year]   Admit date: 10/17/2021  LOS: 0 days   Date of Consultation: 10/18/2021  Reason for Consultation: Rectal bleeding    History of Present Illness   Brandi Neal is a 76 y.o. 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    Patient reports intermittent rectal bleeding since her colon surgery years ago. Although usually just notes a smear of blood on the toilet tissue at times. Over the past week she has noted more rectal bleeding than usual. Worse over the past 2 days. She called her PCP, Dr. Sherrie Sport who advised she come to [facility] ER for evaluation. Patient states she has had some rectal pain with it. She thought maybe a hemorrhoid burst. She has been having to wear a pad due to the bleeding. She passes bloody mucous with small clots this morning. Denies abdominal pain. She states her LLQ is chronically more distended than the right, since her surgeries. She tries to keep her stools soft/loose since she had bowel obstruction in the past. She takes metamucil and OTC laxative. Typically one stool per day. Denies change in stools or frequency, no ill contacts, no recent antibiotics. No UGI symptoms. She is very concerned if she has cancer again.    In the ED: Hemoglobin 12.7, platelets 193,000,, BUN 20, creatinine 1.35.  CT abdomen and pelvis with contrast showed rectosigmoid anastomosis.  Rectosigmoid colon wall thickening with surrounding inflammatory stranding compatible with proctocolitis.  No free air or abscess.  New pancreatic ductal dilatation (7 mm) of uncertain etiology.  Consider dedicated  pancreatic MRI as an outpatient.  Scattered rounded hypodensities seen throughout the liver favoring cyst, largest measuring 1.6 cm.  On presentation suggestive of proctocolitis.    In the ED, question of Afib, no known prior history. Repeat ECG this morning by attending.   Patient seen by cardiology as an outpatient on Oct 09, 2021 for follow-up of chest discomfort, syncope.  Syncope thought to be neurocardiogenic/vasovagal in nature.  [place] SPECT study low risk.  Echocardiogram showed mild mitral regurgitation, mild to moderate aortic regurgitation, moderately dilated left atrium, dilated ascending aorta (4.0 cm).  LVEF 60 to 65%.  Plan is to continue medical therapy.    Last colonoscopy on file from 2014, Dr. Earley Brooke, unremarkable except for internal hemorrhoids.  Colonoscopy at [facility] by Dr. Anthony Sar about five years ago per patient.    Prior to Admission medications   Medication Sig Start Date End Date Taking? Authorizing Provider  Calcium Carbonate-Vitamin D (CALCIUM 600+D3) 600-400 MG-UNIT per tablet Take 1 tablet by mouth 2 (two) times daily.    [provider]  FLUoxetine (PROZAC) 40 MG capsule Take 40 mg by mouth daily.    [provider]  Levothyroxine Sodium (SYNTHROID PO) Take 0.075 mg by mouth daily.    [provider]          Multiple Vitamins-Minerals (MULTI FOR HER 50+ PO) Take by mouth daily.    [provider]  Omega-3 Fatty Acids (FISH OIL) 1200 MG CAPS Take by mouth daily.    [provider]  simvastatin (ZOCOR) 40 MG tablet Take 20 mg by mouth daily.    [provider]  tretinoin (RETIN-A) 0.1 % cream Apply topically at bedtime as needed.  12/28/12   [provider]  valsartan-hydrochlorothiazide (DIOVAN-HCT) 160-12.5 MG per tablet Take 1 tablet by mouth daily.    [provider]    Current Facility-Administered Medications  Medication Dose Route Frequency Provider Last Rate Last Admin    HYDROmorphone (DILAUDID) injection 0.5 mg  0.5 mg Intravenous Q3H PRN Adefeso, Oladapo, DO       lactated ringers infusion   Intravenous Continuous Adefeso, Oladapo, DO 125 mL/hr at 10/18/21 0545 New Bag at 10/18/21 0545    Allergies as of 10/17/2021   (No Known Allergies)    Past Medical History:  Diagnosis Date   Chronic constipation    Depression    Diarrhea    Hyperlipemia    Hypothyroidism    Post-menopausal    Rectal cancer ([place])    Small bowel obstruction due to adhesions [facility])    UTI (urinary tract infection)     Past Surgical History:  Procedure Laterality Date   APPENDECTOMY     BACK SURGERY     2 back surgeries : 1986, 1994 Patient states that she has the [place]     Patient had 3 C-Sections   COLON SURGERY  2007   reversal of ileostomy. complicated by infection requiring healing from inside/out   COLONOSCOPY  12/18/2012   [place]: internal hemorrhoids   Hysterctomy  05/20/1992   RECTAL SURGERY  05/20/2005   low anterior resection with creation of J-pouch and loop ileostomy. For rectal polyp with invasive adenocarcinoma   THYROIDECTOMY   ?1983   TUBAL LIGATION  05/21/1967    Family History  Problem Relation Age of Onset   Kidney failure Mother    Lung cancer Father    Colon cancer Sister    Arthritis Sister    Diabetes Sister    Diabetes Brother    Crohn's disease Son    Healthy Son    Diabetes Daughter    Heart attack Daughter     Social History   Socioeconomic History   Marital status: Married    Spouse name: Not on file   Number of children: Not on file   Years of education: Not on file   Highest education level: Not on file  Occupational History   Not on file  Tobacco Use   Smoking status: Never   Smokeless tobacco: Never  Substance and Sexual Activity   Alcohol use: Yes    Comment: Very Seldom - patient states that she will drink a glass of wine   Drug use: No   Sexual activity: Not on file  Other Topics  Concern   Not on file  Social History Narrative   Not on file   Social Determinants of Health   Financial Resource Strain: Not on file  Food Insecurity: Not on file  Transportation Needs: Not on file  Physical Activity: Not on file  Stress: Not on file  Social Connections: Not on file  Intimate Partner Violence: Not on file     Review of System:   General: Negative for anorexia, weight loss, fever, chills, fatigue, weakness. Eyes: Negative for vision changes.  ENT: Negative for hoarseness, difficulty swallowing , nasal congestion. CV: Negative for chest pain, angina, palpitations, dyspnea on exertion, peripheral edema. Recent cardiac work up for chest discomfort and syncope as outlined above.  Respiratory: Negative for dyspnea at rest, dyspnea on exertion, cough, sputum, wheezing.  GI: See  history of present illness. GU:  Negative for dysuria, hematuria, urinary incontinence, urinary frequency, nocturnal urination.  MS: Negative for joint pain, low back pain.  Derm: Negative for rash or itching.  Neuro: Negative for weakness, abnormal sensation, seizure, frequent headaches, memory loss, confusion.  Psych: Negative for depression, suicidal ideation, hallucinations. +anxiety Endo: Negative for unusual weight change.  Heme: Negative for bruising or bleeding. Allergy: Negative for rash or hives.      Physical Examination:   Vital signs in last 24 hours: Temp:  [97.8 F (36.6 C)-98.2 F (36.8 C)] 97.8 F (36.6 C) (06/01 0657) Pulse Rate:  [71-96] 96 (06/01 0657) Resp:  [11-18] 18 (06/01 0657) BP: (125-168)/(48-84) 164/73 (06/01 0657) SpO2:  [96 %-100 %] 96 % (06/01 0657) Weight:  [76.2 kg] 76.2 kg (06/01 0657)    General: Well-nourished, well-developed in no acute distress.  Head: Normocephalic, atraumatic.   Eyes: Conjunctiva pink, no icterus. Mouth: Oropharyngeal mucosa moist and pink , no lesions erythema or exudate. Neck: Supple without thyromegaly, masses, or  lymphadenopathy.  Lungs: Clear to auscultation bilaterally.  Heart: Regular rate and rhythm, no murmurs rubs or gallops.  Abdomen: Bowel sounds are normal, nontender, nondistended, no hepatosplenomegaly or masses, no abdominal bruits or hernia , no rebound or guarding.   Rectal: examined externally. Blood noted on pad. Bloody mucous noted around the anus. No external hemorrhoids. No internal exam performed.  Extremities: No lower extremity edema, clubbing, deformity.  Neuro: Alert and oriented x 4 , grossly normal neurologically.  Skin: Warm and dry, no rash or jaundice.   Psych: Alert and cooperative, normal mood and affect.        Intake/Output from previous day: No intake/output data recorded. Intake/Output this shift: No intake/output data recorded.  Lab Results:   CBC Recent Labs    10/18/21 0043  WBC 9.1  HGB 12.7  HCT 38.7  MCV 97.7  PLT 193   BMET Recent Labs    10/18/21 0117  NA 141  K 3.9  CL 115*  CO2 19*  GLUCOSE 107*  BUN 20  CREATININE 1.35*  CALCIUM 8.3*   LFT Recent Labs    10/18/21 0117  BILITOT 0.8  ALKPHOS 56  AST 19  ALT 16  PROT 6.7  ALBUMIN 4.1    Lipase Recent Labs    10/18/21 0117  LIPASE 31    PT/INR Recent Labs    10/18/21 0117  LABPROT 13.9  INR 1.1     Hepatitis Panel No results for input(s): HEPBSAG, HCVAB, HEPAIGM, HEPBIGM in the last 72 hours.   Imaging Studies:   CT ABDOMEN PELVIS W CONTRAST  Result Date: 10/18/2021 CLINICAL DATA:  Left lower quadrant abdominal pain. History of rectal cancer. EXAM: CT ABDOMEN AND PELVIS WITH CONTRAST TECHNIQUE: Multidetector CT imaging of the abdomen and pelvis was performed using the standard protocol following bolus administration of intravenous contrast. RADIATION DOSE REDUCTION: This exam was performed according to the departmental dose-optimization program which includes automated exposure control, adjustment of the mA and/or kV according to patient size and/or use of  iterative reconstruction technique. CONTRAST:  48m OMNIPAQUE IOHEXOL 300 MG/ML  SOLN COMPARISON:  CT abdomen and pelvis 03/31/2013 FINDINGS: Lower chest: No acute abnormality. Hepatobiliary: Scattered rounded hypodensities are seen throughout the liver favored as cysts. The largest measures up to 1.6 cm. Gallbladder and bile ducts are within normal limits. Pancreas: There is some new mild pancreatic ductal dilatation measuring up to 7 mm, indeterminate. No focal pancreatic lesion identified. Spleen:  Normal in size without focal abnormality. Adrenals/Urinary Tract: Adrenal glands are unremarkable. Kidneys are normal, without renal calculi, focal lesion, or hydronephrosis. Bladder is unremarkable. Stomach/Bowel: There surgical changes in the stomach and small bowel. Rectosigmoid anastomosis is present. There is rectosigmoid colon wall thickening with surrounding inflammatory stranding. No evidence for perforation or abscess. No bowel obstruction. Appendix is not seen. Remaining colon, small bowel and stomach are within normal limits. Vascular/Lymphatic: Aortic atherosclerosis. No enlarged abdominal or pelvic lymph nodes. Reproductive: Status post hysterectomy. No adnexal masses. Other: There is some bulging of the central abdominal wall. No abdominopelvic ascites. Midline abdominal scar is present. Musculoskeletal: Multilevel degenerative changes affect the spine. L4-S1 posterior fusion hardware is present. IMPRESSION: 1. Rectosigmoid anastomosis is present. There is rectosigmoid colon wall thickening with surrounding inflammatory stranding compatible with proctocolitis. No bowel obstruction, free air or abscess. 2. New pancreatic ductal dilatation of uncertain etiology. Focal pancreatic lesion is not visualized, but can not be excluded. This can be followed up with dedicated pancreatic MRI. Electronically Signed   By: Ronney Asters M.D.   On: 10/18/2021 02:19  [4 week]  Assessment:   Very pleasant 77 year old  female with history of stage I rectal adenocarcinoma status postresection in 2007, history of small bowel obstruction due to adhesions, hypothyroidism who presented with rectal bleeding.  GI consulted for further management.  Rectal bleeding: Patient reports toilet tissue hematochezia off and on for years, she contributes to hemorrhoid issues.  She reports her last colonoscopy was about 5 years ago at [facility], states that she is due now.  Over the past week she has had increased rectal bleeding and passing significant bloody mucus.  Wearing a pad to protect undergarments.  No significant change in bowels, typically has loose stool once daily with fiber and laxative.  Keeps her stools loose because of history of bowel obstruction.  No recent antibiotic use.  No ill contacts.  She underwent CT this admission showing rectosigmoid anastomosis present.  Rectosigmoid colon wall thickening with surrounding inflammatory stranding compatible with proctocolitis.  Based on her symptoms, infection or IBD felt to be less likely. Recommend direct visualization via colonoscopy this admission.  Hemoglobin remains normal  Pancreatic ductal dilatation: New finding since 2014 CT.  Consider outpatient follow-up via dedicated pancreatic MRI if able.  Abnormal ECG:   recent cardiac work up including ECHO, myocardial perfusion SPECT as outlined above.  ECG reported to be sinus rhythm May 2023. Discussed with attending, Dr. Carles Collet, no concerns from his standpoint regarding cardiac issues.   Plan:   Colonoscopy tomorrow.  I have discussed the risks, alternatives, benefits with regards to but not limited to the risk of reaction to medication, bleeding, infection, perforation and the patient is agreeable to proceed. Written consent to be obtained. Collect stool if possible. Passing only bloody mucous so far this admission.  Clear liquids today.   LOS: 0 days   We would like to thank you for the opportunity to participate  in the care of Brandi Neal. Bernarda Caffey [facility] [phone] 6/1/20237:40 AM

## 2021-10-18 NOTE — Assessment & Plan Note (Signed)
Continue amlodipine Restart ARB

## 2021-10-18 NOTE — TOC Progression Note (Signed)
  Transition of Care [facility]) Screening Note   Patient Details  Name: Brandi Neal Date of Birth: 06-Dec-1945   Transition of Care [facility]) CM/SW Contact:    Boneta Lucks, RN Phone Number: 10/18/2021, 1:11 PM    Transition of Care Department [facility]) has reviewed patient and no TOC needs have been identified at this time. We will continue to monitor patient advancement through interdisciplinary progression rounds. If new patient transition needs arise, please place a TOC consult.   Barriers to Discharge: Continued Medical Work up  Expected Discharge Plan and Services Expected Discharge Plan: Home/Self Care

## 2021-10-18 NOTE — ED Notes (Signed)
Attempted to give report again to 3rd floor again

## 2021-10-18 NOTE — Assessment & Plan Note (Signed)
Personally reviewed EKG from the emergency department -Suggest possible atrial fibrillation, but has baseline artifact -Placed on telemetry--sinus Repeat EKG--personally reviewed--sinus with PACs

## 2021-10-18 NOTE — ED Provider Notes (Signed)
Brandi Neal Emergency Department Provider Note MRN:  [id number]  Arrival date & time: 10/18/21     Chief Complaint   Rectal bleeding History of Present Illness   Brandi Neal is a 76 y.o. year-old female with a history of rectal cancer presenting to the ED with chief complaint of rectal bleeding.  Light bleeding from the rectum with wiping over the past 2 to 3 days, today much worse, persistent blood coming from the rectum.  Some rectal pain, as well as some suprapubic abdominal pain today.  Denies lightheadedness, no other complaints.  Review of Systems  A thorough review of systems was obtained and all systems are negative except as noted in the HPI and PMH.   Patient's Health History    Past Medical History:  Diagnosis Date   Chronic constipation    Depression    Diarrhea    Hyperlipemia    Hypothyroidism    Post-menopausal    Rectal cancer ([place])    Small bowel obstruction due to adhesions [facility])    UTI (urinary tract infection)     Past Surgical History:  Procedure Laterality Date   APPENDECTOMY     BACK SURGERY     2 back surgeries : 1986, 1994 Patient states that she has the [place]     Patient had 3 C-Sections   COLONOSCOPY  12/2012   Hysterctomy  1994   RECTAL SURGERY  2007   THYROIDECTOMY   ?[place]    Family History  Problem Relation Age of Onset   Kidney failure Mother    Lung cancer Father    Colon cancer Sister    Arthritis Sister    Diabetes Sister    Diabetes Brother    Crohn's disease Son    Healthy Son    Diabetes Daughter    Heart attack Daughter     Social History   Socioeconomic History   Marital status: Married    Spouse name: Not on file   Number of children: Not on file   Years of education: Not on file   Highest education level: Not on file  Occupational History   Not on file  Tobacco Use   Smoking status: Never   Smokeless tobacco: Never  Substance and  Sexual Activity   Alcohol use: Yes    Comment: Very Seldom - patient states that she will drink a glass of wine   Drug use: No   Sexual activity: Not on file  Other Topics Concern   Not on file  Social History Narrative   Not on file   Social Determinants of Health   Financial Resource Strain: Not on file  Food Insecurity: Not on file  Transportation Needs: Not on file  Physical Activity: Not on file  Stress: Not on file  Social Connections: Not on file  Intimate Partner Violence: Not on file     Physical Exam   Vitals:   10/18/21 0400 10/18/21 0430  BP: (!) 149/60 (!) 168/68  Pulse: 78 79  Resp: 12 17  Temp:    SpO2: 100% 98%    CONSTITUTIONAL: Well-appearing, NAD NEURO/PSYCH:  Alert and oriented x 3, no focal deficits EYES:  eyes equal and reactive ENT/NECK:  no LAD, no JVD CARDIO: Regular rate, well-perfused, normal S1 and S2 PULM:  CTAB no wheezing or rhonchi GI/GU:  non-distended, non-tender MSK/SPINE:  No gross deformities, no edema SKIN:  no rash, atraumatic   *  Additional and/or pertinent findings included in MDM below  Diagnostic and Interventional Summary    EKG Interpretation  Date/Time:    Ventricular Rate:    PR Interval:    QRS Duration:   QT Interval:    QTC Calculation:   R Axis:     Text Interpretation:         Labs Reviewed  COMPREHENSIVE METABOLIC PANEL - Abnormal; Notable for the following components:      Result Value   Chloride 115 (*)    CO2 19 (*)    Glucose, Bld 107 (*)    Creatinine, Ser 1.35 (*)    Calcium 8.3 (*)    GFR, Estimated 41 (*)    All other components within normal limits  GASTROINTESTINAL PANEL BY PCR, STOOL (REPLACES STOOL CULTURE)  C DIFFICILE QUICK SCREEN W PCR REFLEX    CBC  LIPASE, BLOOD  PROTIME-INR    CT ABDOMEN PELVIS W CONTRAST  Final Result      Medications  lactated ringers infusion (has no administration in time range)  fentaNYL (SUBLIMAZE) injection 50 mcg (50 mcg Intravenous Given  10/18/21 0046)  HYDROmorphone (DILAUDID) injection 1 mg (1 mg Intravenous Given 10/18/21 0131)  iohexol (OMNIPAQUE) 300 MG/ML solution 75 mL (75 mLs Intravenous Contrast Given 10/18/21 0152)  HYDROmorphone (DILAUDID) injection 1 mg (1 mg Intravenous Given 10/18/21 0513)     Procedures  /  Critical Care Procedures  ED Course and Medical Decision Making  Initial Impression and Ddx Given history of rectal cancer recurrence is considered in differential diagnosis.  Hemorrhoids also considered as patient has a history of such.  Lower GI bleeding also a possibility.  Patient is not anticoagulated.  Awaiting labs, will obtain CT to evaluate for obvious mass or abnormality.  Past medical/surgical history that increases complexity of ED encounter: Colon cancer  Interpretation of Diagnostics I personally reviewed the laboratory evaluation and my interpretation is as follows: No significant blood count or electrolyte disturbance    Patient Reassessment and Ultimate Disposition/Management     Patient with continued pain, mild/minimal bleeding.  CT scan demonstrating proctocolitis.  Case discussed with Dr. Laural Golden of gastroenterology, recommending admission, stool studies.  Depending on stool study results, may need combination of steroids and antibiotics.  Will likely also need sigmoidoscopy.  Patient management required discussion with the following services or consulting groups:  Hospitalist Service and Gastroenterology  Complexity of Problems Addressed Acute illness or injury that poses threat of life of bodily function  Additional Data Reviewed and Analyzed Further history obtained from: Further history from spouse/family member  Additional Factors Impacting ED Encounter Risk Use of parenteral controlled substances and Consideration of hospitalization  Barth Kirks. [place] Small, [place] [email or web address]  Final Clinical Impressions(s) / ED  Diagnoses     ICD-10-CM   1. 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 Rectal bleeding  K62.5       ED Discharge Orders     None        Discharge Instructions Discussed with and Provided to Patient:   Discharge Instructions   None      Maudie Flakes, MD 10/18/21 [phone]

## 2021-10-18 NOTE — Assessment & Plan Note (Signed)
Patient has not followed up with oncology in nearly 6 years

## 2021-10-18 NOTE — ED Notes (Signed)
Pt assisted to room commode, small amount of bright red blood present in toilet

## 2021-10-18 NOTE — Progress Notes (Signed)
PROGRESS NOTE  Brandi Neal ZOX:[id number] DOB: 06/05/1945 DOA: 10/17/2021 PCP: [name], MD  Brief History:  76 year old female with a history of clinical stage 1 rectal cancer status post resection and rectosigmoid anastomosis, hyperlipidemia, hypothyroidism, depression, hypertension presenting with rectal bleeding that has worsened over the past 2 days.  The patient states that she has had some intermittent rectal bleeding ever since her surgical resection over 15 years ago.  She has noted an increase in rectal bleeding over the last 2 days with associated rectal pain.  She has also been complaining of some intermittent lower abdominal pain and cramping.  She denies any fevers, chills, nausea, vomiting, diarrhea.  She states that she has had surveillance colonoscopies, the last one within the last 5 years.  However, review of care everywhere did not show any recent colonoscopies.  Patient denies any headache, chest pain, shortness breath, dizziness, palpitations.  There is no dysuria or hematuria.  She denies any new medications. In the ED, the patient was afebrile and hemodynamically stable with oxygen saturation 99% room air.  WBC 9.1, hemoglobin 12.7, platelets 193,000.  Sodium 141, potassium 3.9, bicarbonate 19, serum creatinine 1.35.  LFTs were unremarkable.  EKG showed possible atrial fibrillation with nonspecific ST-T wave changes.  GI was consulted to assist with management.     Assessment and Plan: * Rectal bleeding 10/18/2021 CT abdomen and pelvis showed thickening of the rectosigmoid colon with surrounding inflammatory stranding.  There is no perforation or abscess. 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  There is no perforation or abscess. GI consult  H/O colon cancer, stage I Patient has not followed up with oncology in nearly 6 years  Mixed hyperlipidemia Continue statin  Essential hypertension Continue amlodipine  Hypothyroidism Continue Synthroid   Family Communication:  no  Family at bedside  Consultants:  GI  Code Status:  FULL   DVT Prophylaxis:  SCDs   Procedures: As Listed in Progress Note Above  Antibiotics: None    Total time spent 50 minutes.  Greater than 50% spent face to face counseling and coordinating care.    Subjective: Patient continues to have some rectal bleeding.  She denies any fevers, chills, headache, chest pain, shortness breath, palpitations.  There is no nausea, vomiting, diarrhea.  Objective: Vitals:   10/18/21 0500 10/18/21 0530 10/18/21 0600 10/18/21 0657  BP: 138/69 133/61 (!) 147/70 (!) 164/73  Pulse: 79 77 86 96  Resp: '16 12 17 18  '$ Temp:    97.8 F (36.6 C)  TempSrc:    Oral  SpO2: 98% 99% 98% 96%  Weight:    76.2 kg  Height:       No intake or output data in the 24 hours ending 10/18/21 0714 Weight change:  Exam:  General:  Pt is alert, follows commands appropriately, not in acute distress HEENT: No icterus, No thrush, No neck mass, [place]/AT Cardiovascular: RRR, S1/S2, no rubs, no gallops Respiratory: CTA bilaterally, no wheezing, no crackles, no rhonchi Abdomen: Soft/+BS, non tender, non distended, no guarding Extremities: No edema, No lymphangitis, No petechiae, No rashes, no synovitis   Data Reviewed: I have personally reviewed following labs  and imaging studies Basic Metabolic Panel: Recent Labs  Lab 10/18/21 0117  NA 141  K 3.9  CL 115*  CO2 19*  GLUCOSE 107*  BUN 20  CREATININE 1.35*  CALCIUM 8.3*   Liver Function Tests: Recent Labs  Lab 10/18/21 0117  AST 19   ALT 16  ALKPHOS 56  BILITOT 0.8  PROT 6.7  ALBUMIN 4.1   Recent Labs  Lab 10/18/21 0117  LIPASE 31   No results for input(s): AMMONIA in the last 168 hours. Coagulation Profile: Recent Labs  Lab 10/18/21 0117  INR 1.1   CBC: Recent Labs  Lab 10/18/21 0043  WBC 9.1  HGB 12.7  HCT 38.7  MCV 97.7  PLT 193   Cardiac Enzymes: No results for input(s): CKTOTAL, CKMB, CKMBINDEX, TROPONINI in the last 168 hours. BNP: Invalid input(s): POCBNP CBG: No results for input(s): GLUCAP in the last 168 hours. HbA1C: No results for input(s): HGBA1C in the last 72 hours. Urine analysis: No results found for: COLORURINE, APPEARANCEUR, LABSPEC, PHURINE, GLUCOSEU, HGBUR, BILIRUBINUR, KETONESUR, PROTEINUR, UROBILINOGEN, NITRITE, LEUKOCYTESUR Sepsis Labs: '@LABRCNTIP'$ (procalcitonin:4,lacticidven:4) )No results found for this or any previous visit (from the past 240 hour(s)).   Scheduled Meds:  Continuous Infusions:  lactated ringers 125 mL/hr at 10/18/21 0545    Procedures/Studies: CT ABDOMEN PELVIS W CONTRAST  Result Date: 10/18/2021 CLINICAL DATA:  Left lower quadrant abdominal pain. History of rectal cancer. EXAM: CT ABDOMEN AND PELVIS WITH CONTRAST TECHNIQUE: Multidetector CT imaging of the abdomen and pelvis was performed using the standard protocol following bolus administration of intravenous contrast. RADIATION DOSE REDUCTION: This exam was performed according to the departmental dose-optimization program which includes automated exposure control, adjustment of the mA and/or kV according to patient size and/or use of iterative reconstruction technique. CONTRAST:  82m OMNIPAQUE IOHEXOL 300 MG/ML  SOLN COMPARISON:  CT abdomen and pelvis 03/31/2013 FINDINGS: Lower chest: No acute abnormality. Hepatobiliary: Scattered rounded hypodensities are seen throughout the liver favored as cysts. The largest measures up to 1.6 cm. Gallbladder and bile ducts are within normal limits. Pancreas:  There is some new mild pancreatic ductal dilatation measuring up to 7 mm, indeterminate. No focal pancreatic lesion identified. Spleen: Normal in size without focal abnormality. Adrenals/Urinary Tract: Adrenal glands are unremarkable. Kidneys are normal, without renal calculi, focal lesion, or hydronephrosis. Bladder is unremarkable. Stomach/Bowel: There surgical changes in the stomach and small bowel. Rectosigmoid anastomosis is present. There is rectosigmoid colon wall thickening with surrounding inflammatory stranding. No evidence for perforation or abscess. No bowel obstruction. Appendix is not seen. Remaining colon, small bowel and stomach are within normal limits. Vascular/Lymphatic: Aortic atherosclerosis. No enlarged abdominal or pelvic lymph nodes. Reproductive: Status post hysterectomy. No adnexal masses. Other: There is some bulging of the central abdominal wall. No abdominopelvic ascites. Midline abdominal scar is present. Musculoskeletal: Multilevel degenerative changes affect the spine. L4-S1 posterior fusion hardware is present. IMPRESSION: 1. Rectosigmoid anastomosis is present. There is rectosigmoid colon wall thickening with surrounding inflammatory stranding compatible with proctocolitis. No bowel obstruction, free air or abscess. 2. New pancreatic ductal dilatation of uncertain etiology. Focal pancreatic lesion is not visualized, but can not be excluded. This can be followed up with dedicated pancreatic MRI. Electronically Signed   By: ARonney AstersM.D.   On: 10/18/2021 02:19    DOrson Eva DO  [facility]  If 7PM-7AM, please contact night-coverage [email or web address] Password [email or web address] 10/18/2021, 7:14 AM   LOS: 0 days

## 2021-10-18 NOTE — Assessment & Plan Note (Signed)
10/18/2021 CT abdomen and pelvis showed thickening of the rectosigmoid colon with surrounding inflammatory stranding.  There is no perforation or abscess. GI consult Hemoglobin stable

## 2021-10-18 NOTE — Assessment & Plan Note (Signed)
10/18/2021 CT abdomen and pelvis showed thickening of the rectosigmoid colon with surrounding inflammatory stranding.  There is no perforation or abscess. GI consult

## 2021-10-19 ENCOUNTER — Inpatient Hospital Stay (HOSPITAL_COMMUNITY): Payer: Medicare Other | Admitting: Anesthesiology

## 2021-10-19 ENCOUNTER — Encounter (HOSPITAL_COMMUNITY): Payer: Self-pay | Admitting: Internal Medicine

## 2021-10-19 ENCOUNTER — Encounter (HOSPITAL_COMMUNITY)
Admission: EM | Disposition: A | Discharge status: Home / Self Care | Payer: Self-pay | Source: Home / Self Care | Attending: Internal Medicine

## 2021-10-19 DIAGNOSIS — K621 Rectal polyp: Secondary | ICD-10-CM

## 2021-10-19 DIAGNOSIS — E039 Hypothyroidism, unspecified: Secondary | ICD-10-CM

## 2021-10-19 DIAGNOSIS — N1831 Chronic kidney disease, stage 3a: Secondary | ICD-10-CM

## 2021-10-19 DIAGNOSIS — I10 Essential (primary) hypertension: Secondary | ICD-10-CM

## 2021-10-19 DIAGNOSIS — R9431 Abnormal electrocardiogram [ECG] [EKG]: Secondary | ICD-10-CM

## 2021-10-19 DIAGNOSIS — K529 Noninfective gastroenteritis and colitis, unspecified: Secondary | ICD-10-CM | POA: Diagnosis not present

## 2021-10-19 DIAGNOSIS — K6389 Other specified diseases of intestine: Secondary | ICD-10-CM

## 2021-10-19 HISTORY — PX: POLYPECTOMY: SHX5525

## 2021-10-19 HISTORY — PX: COLONOSCOPY WITH PROPOFOL: SHX5780

## 2021-10-19 HISTORY — PX: BIOPSY: SHX5522

## 2021-10-19 LAB — COMPREHENSIVE METABOLIC PANEL
ALT: 11 U/L (ref 0–44)
AST: 16 U/L (ref 15–41)
Albumin: 3.5 g/dL (ref 3.5–5.0)
Alkaline Phosphatase: 51 U/L (ref 38–126)
Anion gap: 9 (ref 5–15)
BUN: 11 mg/dL (ref 8–23)
CO2: 22 mmol/L (ref 22–32)
Calcium: 8.1 mg/dL — ABNORMAL LOW (ref 8.9–10.3)
Chloride: 111 mmol/L (ref 98–111)
Creatinine, Ser: 1 mg/dL (ref 0.44–1.00)
GFR, Estimated: 58 mL/min — ABNORMAL LOW (ref >60)
Glucose, Bld: 101 mg/dL — ABNORMAL HIGH (ref 70–99)
Potassium: 4 mmol/L (ref 3.5–5.1)
Sodium: 142 mmol/L (ref 135–145)
Total Bilirubin: 1.4 mg/dL — ABNORMAL HIGH (ref 0.3–1.2)
Total Protein: 5.9 g/dL — ABNORMAL LOW (ref 6.5–8.1)

## 2021-10-19 LAB — C DIFFICILE QUICK SCREEN W PCR REFLEX
C Diff antigen: NEGATIVE
C Diff interpretation: NOT DETECTED
C Diff toxin: NEGATIVE

## 2021-10-19 LAB — CBC
HCT: 35.1 % — ABNORMAL LOW (ref 36.0–46.0)
Hemoglobin: 11.4 g/dL — ABNORMAL LOW (ref 12.0–15.0)
MCH: 32 pg (ref 26.0–34.0)
MCHC: 32.5 g/dL (ref 30.0–36.0)
MCV: 98.6 fL (ref 80.0–100.0)
Platelets: 160 10*3/uL (ref 150–400)
RBC: 3.56 MIL/uL — ABNORMAL LOW (ref 3.87–5.11)
RDW: 13.2 % (ref 11.5–15.5)
WBC: 9 10*3/uL (ref 4.0–10.5)
nRBC: 0 % (ref 0.0–0.2)

## 2021-10-19 LAB — MAGNESIUM: Magnesium: 1.8 mg/dL (ref 1.7–2.4)

## 2021-10-19 SURGERY — COLONOSCOPY WITH PROPOFOL
Anesthesia: General

## 2021-10-19 MED ORDER — PROPOFOL 10 MG/ML IV BOLUS
INTRAVENOUS | Status: DC | PRN
  Administered 2021-10-19: 20 mg via INTRAVENOUS
  Administered 2021-10-19: 100 mg via INTRAVENOUS
  Administered 2021-10-19: 50 mg via INTRAVENOUS
  Administered 2021-10-19 (×2): 40 mg via INTRAVENOUS

## 2021-10-19 MED ORDER — METRONIDAZOLE 500 MG/100ML IV SOLN
500.0000 mg | Freq: Two times a day (BID) | INTRAVENOUS | Status: DC
  Administered 2021-10-19 – 2021-10-20 (×2): 500 mg via INTRAVENOUS
  Filled 2021-10-19 (×2): qty 100

## 2021-10-19 MED ORDER — PHENYLEPHRINE 80 MCG/ML (10ML) SYRINGE FOR IV PUSH (FOR BLOOD PRESSURE SUPPORT)
PREFILLED_SYRINGE | INTRAVENOUS | Status: DC | PRN
  Administered 2021-10-19: 80 ug via INTRAVENOUS

## 2021-10-19 MED ORDER — LACTATED RINGERS IV SOLN: INTRAVENOUS | Status: DC

## 2021-10-19 MED ORDER — LIDOCAINE HCL (CARDIAC) PF 100 MG/5ML IV SOSY
PREFILLED_SYRINGE | INTRAVENOUS | Status: DC | PRN
  Administered 2021-10-19: 50 mg via INTRAVENOUS

## 2021-10-19 MED ORDER — SODIUM CHLORIDE 0.9 % IV SOLN
2.0000 g | INTRAVENOUS | Status: DC
  Administered 2021-10-19: 2 g via INTRAVENOUS
  Filled 2021-10-19: qty 20

## 2021-10-19 NOTE — Transfer of Care (Signed)
Immediate Anesthesia Transfer of Care Note  Patient: Brandi Neal  Procedure(s) Performed: COLONOSCOPY WITH PROPOFOL BIOPSY POLYPECTOMY  Patient Location: PACU  Anesthesia Type:General  Level of Consciousness: awake  Airway & Oxygen Therapy: Patient Spontanous Breathing and Patient connected to nasal cannula oxygen  Post-op Assessment: Report given to RN and Post -op Vital signs reviewed and stable  Post vital signs: Reviewed and stable  Last Vitals:  Vitals Value Taken Time  BP 153/66 10/19/21 1118  Temp    Pulse 68 10/19/21 1119  Resp 19 10/19/21 1119  SpO2 99 % 10/19/21 1119  Vitals shown include unvalidated device data.  Last Pain:  Vitals:   10/19/21 1049  TempSrc:   PainSc: 7       Patients Stated Pain Goal: 0 (08/81/10 3159)  Complications: No notable events documented.

## 2021-10-19 NOTE — Tx Team (Signed)
MD Rourk aware patient is having nausea and vomiting after drinking half of bowel prep. Patient given zofran. Verbal orders to continue prep at 0600 and then patient becomes npo then to give tap water enema.

## 2021-10-19 NOTE — Op Note (Signed)
[facility] Patient Name: Brandi Neal Procedure Date: 10/19/2021 10:37 AM MRN: [id number] Date of Birth: [birth year] Attending MD: Elon Alas. Abbey Chatters DO CSN: [id number] Age: 76 Admit Type: Emergency Department Procedure:                Colonoscopy Indications:              Rectal bleeding, Abnormal CT of the GI tract Providers:                Elon Alas. Abbey Chatters, DO, Caprice Kluver, Rosina Lowenstein, RN Referring MD:              Medicines:                See the Anesthesia note for documentation of the                            administered medications Complications:            No immediate complications. Estimated Blood Loss:     Estimated blood loss was minimal. Procedure:                Pre-Anesthesia Assessment:                           - The anesthesia plan was to use monitored                            anesthesia care (MAC).                           After obtaining informed consent, the colonoscope                            was passed under direct vision. Throughout the                            procedure, the patient's blood pressure, pulse, and                            oxygen saturations were monitored continuously. The                            PCF-HQ190L ([id number]) scope was introduced through                            the anus and advanced to the the cecum, identified                            by appendiceal orifice and ileocecal valve. The                            colonoscopy was performed without difficulty. The                            patient tolerated the procedure well. The quality  of the bowel preparation was evaluated using the                            BBPS [facility] Bowel Preparation Scale) with scores                            of: Right Colon = 3, Transverse Colon = 3 and Left                            Colon = 3 (entire mucosa seen well with no residual                            staining, small fragments of stool or  opaque                            liquid). The total BBPS score equals 9. Scope In: 10:54:40 AM Scope Out: 11:14:37 AM Scope Withdrawal Time: 0 hours 18 minutes 5 seconds  Total Procedure Duration: 0 hours 19 minutes 57 seconds  Findings:      The perianal and digital rectal examinations were normal.      Three sessile polyps were found in the transverse colon and ascending       colon. The polyps were 4 to 6 mm in size. These polyps were removed with       a cold snare. Resection and retrieval were complete.      Segmental moderate to severe inflammation characterized by congestion       (edema), erythema, friability and shallow ulcerations was found in the       recto-sigmoid colon and in the sigmoid colon. Distal rectum spared.       Extent of inflammation approximately 5cm-25cm from anal verge Biopsies       were taken with a cold forceps for histology.      A 20 mm polyp was found in the rectum. The polyp was sessile. The polyp       was removed with a hot snare. Resection and retrieval were complete. Impression:               - Three 4 to 6 mm polyps in the transverse colon                            and in the ascending colon, removed with a cold                            snare. Resected and retrieved.                           - Segmental severe inflammation was found in the                            recto-sigmoid colon and in the sigmoid colon                            secondary to proctosigmoid colitis. Biopsied.                           -  One 20 mm polyp in the rectum, removed with a hot                            snare. Resected and retrieved. Moderate Sedation:      Per Anesthesia Care Recommendation:           - Return patient to hospital ward for ongoing care.                           - Soft diet.                           - Etiology of sigmoiditis unclear, likely ischemic.                            Can't rule out infectious of less likely underlying                             IBD. Given severity, I would start patient on ABX.                            C diff testing has already been ordered. Await                            pathology results. Repeat colonoscopy timing to be                            determined. Procedure Code(s):        --- Professional ---                           [phone], Colonoscopy, flexible; with removal of                            tumor(s), polyp(s), or other lesion(s) by snare                            technique                           [id number], 77, Colonoscopy, flexible; with biopsy,                            single or multiple Diagnosis Code(s):        --- Professional ---                           K63.5, Polyp of colon                           K63.89, Other specified diseases of intestine                           K62.1, Rectal polyp  K62.5, Hemorrhage of anus and rectum                           R93.3, Abnormal findings on diagnostic imaging of                            other parts of digestive tract CPT copyright 2019 [employer]. All rights reserved. The codes documented in this report are preliminary and upon coder review may  be revised to meet current compliance requirements. Elon Alas. Abbey Chatters, DO [place] Abbey Chatters, DO 10/19/2021 12:24:01 PM This report has been signed electronically. Number of Addenda: 0

## 2021-10-19 NOTE — Anesthesia Procedure Notes (Signed)
Date/Time: 10/19/2021 10:56 AM Performed by: Orlie Dakin, CRNA Pre-anesthesia Checklist: Patient identified, Emergency Drugs available, Suction available and Patient being monitored Patient Re-evaluated:Patient Re-evaluated prior to induction Oxygen Delivery Method: Nasal cannula Induction Type: IV induction Placement Confirmation: positive ETCO2

## 2021-10-19 NOTE — Interval H&P Note (Signed)
History and Physical Interval Note:  10/19/2021 10:40 AM  Brandi Neal  has presented today for surgery, with the diagnosis of rectal bleeding, abnormal colon on CT, h/o remote rectal adenocarcinoma.  The various methods of treatment have been discussed with the patient and family. After consideration of risks, benefits and other options for treatment, the patient has consented to  Procedure(s): COLONOSCOPY WITH PROPOFOL (N/A) as a surgical intervention.  The patient's history has been reviewed, patient examined, no change in status, stable for surgery.  I have reviewed the patient's chart and labs.  Questions were answered to the patient's satisfaction.     Eloise Harman

## 2021-10-19 NOTE — Progress Notes (Signed)
Tap water enema done. Stool clear and yellow. Notfied preop.

## 2021-10-19 NOTE — Progress Notes (Signed)
PROGRESS NOTE  Brandi Neal BOF:[id number] DOB: [birth year] DOA: 10/17/2021 PCP: [name], MD  Brief History:  76 year old female with a history of clinical stage 1 rectal cancer status post resection and rectosigmoid anastomosis, hyperlipidemia, hypothyroidism, depression, hypertension presenting with rectal bleeding that has worsened over the past 2 days.  The patient states that she has had some intermittent rectal bleeding ever since her surgical resection over 15 years ago.  She has noted an increase in rectal bleeding over the last 2 days with associated rectal pain.  She has also been complaining of some intermittent lower abdominal pain and cramping.  She denies any fevers, chills, nausea, vomiting, diarrhea.  She states that she has had surveillance colonoscopies, the last one within the last 5 years.  However, review of care everywhere did not show any recent colonoscopies.  Patient denies any headache, chest pain, shortness breath, dizziness, palpitations.  There is no dysuria or hematuria.  She denies any new medications. In the ED, the patient was afebrile and hemodynamically stable with oxygen saturation 99% room air.  WBC 9.1, hemoglobin 12.7, platelets 193,000.  Sodium 141, potassium 3.9, bicarbonate 19, serum creatinine 1.35.  LFTs were unremarkable.  EKG showed possible atrial fibrillation with nonspecific ST-T wave changes.  GI was consulted to assist with management.     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  There is no perforation or abscess. GI consult appreciated 10/19/21 colonoscopy--severe proctosigmoiditis.  Approximately 20 cm long.  Likely ischemic;  3 polyps R-colon and one rectal polyp --await stool studies --start ceftriaxone and metronidazole  Rectal bleeding 10/18/2021 CT abdomen and pelvis showed thickening of the rectosigmoid colon  with surrounding inflammatory stranding.  There is no perforation or abscess. GI consult appreciated Hemoglobin stable  Abnormal EKG Personally reviewed EKG from the emergency department -Suggest possible atrial fibrillation, but has baseline artifact -Placed on telemetry--sinus Repeat EKG--personally reviewed--sinus with PACs  Chronic kidney disease, stage 3a (HCC) Baseline creatinine 1.1-1.2 Monitor BMP  H/O colon cancer, stage I Patient has not followed up with oncology in nearly 6 years  Mixed hyperlipidemia Continue statin  Essential hypertension Continue amlodipine Restart ARB  Hypothyroidism Continue Synthroid    Family Communication:   spouse updated at bedside 6/2   Consultants:  GI  Code Status:  FULL   DVT Prophylaxis:  SCDs   Procedures: As Listed in Progress Note Above  Antibiotics: Ceftriaxone 6/2>> Metronidazole 6/2>>     Subjective: Patient states abd pain is improving and with decreased amount of hematochezia.  Denies f/c, cp, sob, n/v  Objective: Vitals:   10/19/21 1040 10/19/21 1120 10/19/21 1130 10/19/21 1131  BP: (!) 160/59 (!) 153/66  (!) 150/71  Pulse: 80 68  77  Resp: 18 19  (!) 21  Temp: 97.9 F (36.6 C) 97.7 F (36.5 C)  97.7 F (36.5 C)  TempSrc:      SpO2: 99% 99% 98% 98%  Weight:      Height:        Intake/Output Summary (Last 24 hours) at 10/19/2021 1707 Last data filed at 10/19/2021 1600 Gross per 24 hour  Intake 3400 ml  Output --  Net 3400 ml   Weight change:  Exam:  General:  Pt is alert, follows commands appropriately, not in acute distress HEENT: No icterus, No thrush, No neck mass, [place]/AT Cardiovascular: RRR, S1/S2, no rubs, no  gallops Respiratory: CTA bilaterally, no wheezing, no crackles, no rhonchi Abdomen: Soft/+BS, mild LLQ tender, non distended, no guarding Extremities: 1 + LE edema, No lymphangitis, No petechiae, No rashes, no synovitis   Data Reviewed: I have personally reviewed following  labs and imaging studies Basic Metabolic Panel: Recent Labs  Lab 10/18/21 0117 10/19/21 0514  NA 141 142  K 3.9 4.0  CL 115* 111  CO2 19* 22  GLUCOSE 107* 101*  BUN 20 11  CREATININE 1.35* 1.00  CALCIUM 8.3* 8.1*  MG 2.0 1.8  PHOS 4.2  --    Liver Function Tests: Recent Labs  Lab 10/18/21 0117 10/19/21 0514  AST 19 16  ALT 16 11  ALKPHOS 56 51  BILITOT 0.8 1.4*  PROT 6.7 5.9*  ALBUMIN 4.1 3.5   Recent Labs  Lab 10/18/21 0117  LIPASE 31   No results for input(s): AMMONIA in the last 168 hours. Coagulation Profile: Recent Labs  Lab 10/18/21 0117  INR 1.1   CBC: Recent Labs  Lab 10/18/21 0043 10/19/21 0514  WBC 9.1 9.0  HGB 12.7 11.4*  HCT 38.7 35.1*  MCV 97.7 98.6  PLT 193 160   Cardiac Enzymes: No results for input(s): CKTOTAL, CKMB, CKMBINDEX, TROPONINI in the last 168 hours. BNP: Invalid input(s): POCBNP CBG: No results for input(s): GLUCAP in the last 168 hours. HbA1C: No results for input(s): HGBA1C in the last 72 hours. Urine analysis: No results found for: COLORURINE, APPEARANCEUR, LABSPEC, PHURINE, GLUCOSEU, HGBUR, BILIRUBINUR, KETONESUR, PROTEINUR, UROBILINOGEN, NITRITE, LEUKOCYTESUR Sepsis Labs: '@LABRCNTIP'$ (procalcitonin:4,lacticidven:4) )No results found for this or any previous visit (from the past 240 hour(s)).   Scheduled Meds:  Continuous Infusions:   Procedures/Studies: CT ABDOMEN PELVIS W CONTRAST  Result Date: 10/18/2021 CLINICAL DATA:  Left lower quadrant abdominal pain. History of rectal cancer. EXAM: CT ABDOMEN AND PELVIS WITH CONTRAST TECHNIQUE: Multidetector CT imaging of the abdomen and pelvis was performed using the standard protocol following bolus administration of intravenous contrast. RADIATION DOSE REDUCTION: This exam was performed according to the departmental dose-optimization program which includes automated exposure control, adjustment of the mA and/or kV according to patient size and/or use of iterative  reconstruction technique. CONTRAST:  33m OMNIPAQUE IOHEXOL 300 MG/ML  SOLN COMPARISON:  CT abdomen and pelvis 03/31/2013 FINDINGS: Lower chest: No acute abnormality. Hepatobiliary: Scattered rounded hypodensities are seen throughout the liver favored as cysts. The largest measures up to 1.6 cm. Gallbladder and bile ducts are within normal limits. Pancreas: There is some new mild pancreatic ductal dilatation measuring up to 7 mm, indeterminate. No focal pancreatic lesion identified. Spleen: Normal in size without focal abnormality. Adrenals/Urinary Tract: Adrenal glands are unremarkable. Kidneys are normal, without renal calculi, focal lesion, or hydronephrosis. Bladder is unremarkable. Stomach/Bowel: There surgical changes in the stomach and small bowel. Rectosigmoid anastomosis is present. There is rectosigmoid colon wall thickening with surrounding inflammatory stranding. No evidence for perforation or abscess. No bowel obstruction. Appendix is not seen. Remaining colon, small bowel and stomach are within normal limits. Vascular/Lymphatic: Aortic atherosclerosis. No enlarged abdominal or pelvic lymph nodes. Reproductive: Status post hysterectomy. No adnexal masses. Other: There is some bulging of the central abdominal wall. No abdominopelvic ascites. Midline abdominal scar is present. Musculoskeletal: Multilevel degenerative changes affect the spine. L4-S1 posterior fusion hardware is present. IMPRESSION: 1. Rectosigmoid anastomosis is present. There is rectosigmoid colon wall thickening with surrounding inflammatory stranding compatible with proctocolitis. No bowel obstruction, free air or abscess. 2. New pancreatic ductal dilatation of uncertain etiology. Focal pancreatic lesion  is not visualized, but can not be excluded. This can be followed up with dedicated pancreatic MRI. Electronically Signed   By: Ronney Asters M.D.   On: 10/18/2021 02:19    Orson Eva, DO  [facility]  If 7PM-7AM, please  contact night-coverage [email or web address] Password [email or web address] 10/19/2021, 5:07 PM   LOS: 1 day

## 2021-10-19 NOTE — Tx Team (Signed)
Patient was able to finish 80% of bowel prep was not able to tolerate anymore.

## 2021-10-19 NOTE — Anesthesia Preprocedure Evaluation (Signed)
Anesthesia Evaluation  Patient identified by MRN, date of birth, ID band Patient awake    Reviewed: Allergy & Precautions, H&P , NPO status , Patient's Chart, lab work & pertinent test results, reviewed documented beta blocker date and time   Airway Mallampati: II  TM Distance: >3 FB Neck ROM: full    Dental no notable dental hx.    Pulmonary neg pulmonary ROS,    Pulmonary exam normal breath sounds clear to auscultation       Cardiovascular Exercise Tolerance: Good hypertension,  Rhythm:regular Rate:Normal     Neuro/Psych PSYCHIATRIC DISORDERS Depression negative neurological ROS     GI/Hepatic negative GI ROS, Neg liver ROS,   Endo/Other  Hypothyroidism   Renal/GU CRFRenal disease  negative genitourinary   Musculoskeletal   Abdominal   Peds  Hematology negative hematology ROS (+)   Anesthesia Other Findings   Reproductive/Obstetrics negative OB ROS                             Anesthesia Physical Anesthesia Plan  ASA: 3 and emergent  Anesthesia Plan: General   Post-op Pain Management:    Induction:   PONV Risk Score and Plan: Propofol infusion  Airway Management Planned:   Additional Equipment:   Intra-op Plan:   Post-operative Plan:   Informed Consent: I have reviewed the patients History and Physical, chart, labs and discussed the procedure including the risks, benefits and alternatives for the proposed anesthesia with the patient or authorized representative who has indicated his/her understanding and acceptance.     Dental Advisory Given  Plan Discussed with: CRNA  Anesthesia Plan Comments:         Anesthesia Quick Evaluation

## 2021-10-19 NOTE — Care Management Important Message (Signed)
Important Message  Patient Details  Name: Brandi Neal MRN: [id number] Date of Birth: 1946/01/04   Medicare Important Message Given:  N/A - LOS <3 / Initial given by admissions     Tommy Medal 10/19/2021, 1:50 PM

## 2021-10-20 ENCOUNTER — Telehealth: Payer: Self-pay | Admitting: Internal Medicine

## 2021-10-20 DIAGNOSIS — N1831 Chronic kidney disease, stage 3a: Secondary | ICD-10-CM | POA: Diagnosis not present

## 2021-10-20 DIAGNOSIS — R9431 Abnormal electrocardiogram [ECG] [EKG]: Secondary | ICD-10-CM | POA: Diagnosis not present

## 2021-10-20 DIAGNOSIS — I10 Essential (primary) hypertension: Secondary | ICD-10-CM | POA: Diagnosis not present

## 2021-10-20 DIAGNOSIS — K529 Noninfective gastroenteritis and colitis, unspecified: Secondary | ICD-10-CM | POA: Diagnosis not present

## 2021-10-20 LAB — GASTROINTESTINAL PANEL BY PCR, STOOL (REPLACES STOOL CULTURE)

## 2021-10-20 LAB — CBC
HCT: 33.9 % — ABNORMAL LOW (ref 36.0–46.0)
Hemoglobin: 11 g/dL — ABNORMAL LOW (ref 12.0–15.0)
MCH: 31.7 pg (ref 26.0–34.0)
MCHC: 32.4 g/dL (ref 30.0–36.0)
MCV: 97.7 fL (ref 80.0–100.0)
Platelets: 148 10*3/uL — ABNORMAL LOW (ref 150–400)
RBC: 3.47 MIL/uL — ABNORMAL LOW (ref 3.87–5.11)
RDW: 13.2 % (ref 11.5–15.5)
WBC: 7 10*3/uL (ref 4.0–10.5)
nRBC: 0 % (ref 0.0–0.2)

## 2021-10-20 LAB — BASIC METABOLIC PANEL
Anion gap: 5 (ref 5–15)
BUN: 10 mg/dL (ref 8–23)
CO2: 26 mmol/L (ref 22–32)
Calcium: 8 mg/dL — ABNORMAL LOW (ref 8.9–10.3)
Chloride: 111 mmol/L (ref 98–111)
Creatinine, Ser: 1.1 mg/dL — ABNORMAL HIGH (ref 0.44–1.00)
GFR, Estimated: 52 mL/min — ABNORMAL LOW (ref >60)
Glucose, Bld: 89 mg/dL (ref 70–99)
Potassium: 4 mmol/L (ref 3.5–5.1)
Sodium: 142 mmol/L (ref 135–145)

## 2021-10-20 MED ORDER — AMOXICILLIN-POT CLAVULANATE 875-125 MG PO TABS
1.0000 | ORAL_TABLET | Freq: Two times a day (BID) | ORAL | Status: DC
  Administered 2021-10-20: 1 via ORAL
  Filled 2021-10-20: qty 1

## 2021-10-20 MED ORDER — AMOXICILLIN-POT CLAVULANATE 875-125 MG PO TABS: 1.0000 | ORAL_TABLET | Freq: Two times a day (BID) | ORAL | 0 refills | Status: DC

## 2021-10-20 MED ORDER — TRAMADOL HCL 50 MG PO TABS: 50.0000 mg | ORAL_TABLET | Freq: Four times a day (QID) | ORAL | Status: DC | PRN

## 2021-10-20 NOTE — Discharge Summary (Signed)
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  The patient states that she has had some intermittent rectal bleeding ever since her surgical resection over 15 years ago.  She has noted an increase in rectal bleeding over the last 2 days with associated rectal pain.  She has also been complaining of some intermittent lower abdominal pain and cramping.  She denies any fevers, chills, nausea, vomiting, diarrhea.  She states that she has had surveillance colonoscopies, the last one within the last 5 years.  However, review of care everywhere did not show any recent colonoscopies.  Patient denies any headache, chest pain, shortness breath, dizziness, palpitations.  There is no dysuria or hematuria.  She denies any new medications. In the ED, the patient was afebrile and hemodynamically stable with oxygen saturation 99% room air.  WBC 9.1, hemoglobin 12.7, platelets 193,000.  Sodium 141, potassium 3.9, bicarbonate 19, serum creatinine 1.35.  LFTs were unremarkable.  EKG showed possible atrial fibrillation with nonspecific ST-T wave changes.  GI was consulted to assist with management.  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  There is no perforation or abscess. GI consult appreciated 10/19/21 colonoscopy--severe proctosigmoiditis.  Approximately  20 cm long.  Likely ischemic;  3 polyps R-colon and one rectal polyp --await stool studies --start ceftriaxone and metronidazole>>d/c home with amox/clav x 6 more days  Rectal bleeding 10/18/2021 CT abdomen and pelvis showed thickening of the rectosigmoid colon with surrounding inflammatory stranding.  There is no perforation or abscess. -due to ischemic colitis -continues to improve GI consult appreciated Hemoglobin stable  Abnormal EKG Personally reviewed EKG from the emergency department -Suggest possible atrial fibrillation, but has baseline artifact -Placed on telemetry--sinus throughout the hospitalization Repeat EKG--personally reviewed--sinus with PACs  Chronic kidney disease, stage 3a (HCC) Baseline creatinine 1.1-1.2 Serum creatinine 1.10 on day of d/c  H/O colon cancer, stage I Patient has not followed up with oncology in nearly 6 years  Mixed hyperlipidemia Continue statin  Essential hypertension Continue amlodipine --will not restart ARB at time of d/c --BPs remained controlled --follow up with PCP for BP check  Hypothyroidism Continue Synthroid         Consultants: GI  Procedures performed: colonoscopy 6/1  Disposition: Home Diet recommendation:  Cardiac diet DISCHARGE MEDICATION: Allergies as of 10/20/2021   No Known Allergies      Medication List     STOP taking these medications    valsartan 160 MG tablet Commonly known as: DIOVAN       TAKE these medications    amLODipine 5 MG tablet Commonly known as: NORVASC Take 5 mg by mouth daily.   amoxicillin-clavulanate 875-125 MG tablet Commonly known as: AUGMENTIN Take 1 tablet by mouth every 12 (twelve) hours.   Calcium Carbonate-Vitamin D 600-400 MG-UNIT tablet Take 1 tablet by mouth 2 (two) times daily.   FLUoxetine 40 MG capsule Commonly known as: PROZAC  Take 40 mg by mouth daily.   levothyroxine 75 MCG tablet Commonly known as: SYNTHROID Take 75 mcg by mouth daily.    LORazepam 0.5 MG tablet Commonly known as: ATIVAN Take 0.5 mg by mouth at bedtime.   lubiprostone 8 MCG capsule Commonly known as: Amitiza Take 2 capsules (16 mcg total) by mouth 2 (two) times daily.   simvastatin 20 MG tablet Commonly known as: ZOCOR Take 20 mg by mouth at bedtime.   traMADol 50 MG tablet Commonly known as: ULTRAM Take 50 mg by mouth 3 (three) times daily.   tretinoin 0.1 % cream Commonly known as: RETIN-A Apply 1 application. topically at bedtime as needed (acne).        Discharge Exam: Filed Weights   10/17/21 2246 10/18/21 0657  Weight: 76.2 kg 76.2 kg   HEENT:  [place]/AT, No thrush, no icterus CV:  RRR, no rub, no S3, no S4 Lung:  CTA, no wheeze, no rhonchi Abd:  soft/+BS, LLQ mild tender Ext:  No edema, no lymphangitis, no synovitis, no rash   Condition at discharge: stable  The results of significant diagnostics from this hospitalization (including imaging, microbiology, ancillary and laboratory) are listed below for reference.   Imaging Studies: CT ABDOMEN PELVIS W CONTRAST  Result Date: 10/18/2021 CLINICAL DATA:  Left lower quadrant abdominal pain. History of rectal cancer. EXAM: CT ABDOMEN AND PELVIS WITH CONTRAST TECHNIQUE: Multidetector CT imaging of the abdomen and pelvis was performed using the standard protocol following bolus administration of intravenous contrast. RADIATION DOSE REDUCTION: This exam was performed according to the departmental dose-optimization program which includes automated exposure control, adjustment of the mA and/or kV according to patient size and/or use of iterative reconstruction technique. CONTRAST:  61m OMNIPAQUE IOHEXOL 300 MG/ML  SOLN COMPARISON:  CT abdomen and pelvis 03/31/2013 FINDINGS: Lower chest: No acute abnormality. Hepatobiliary: Scattered rounded hypodensities are seen throughout the liver favored as cysts. The largest measures up to 1.6 cm. Gallbladder and bile ducts are within normal limits. Pancreas:  There is some new mild pancreatic ductal dilatation measuring up to 7 mm, indeterminate. No focal pancreatic lesion identified. Spleen: Normal in size without focal abnormality. Adrenals/Urinary Tract: Adrenal glands are unremarkable. Kidneys are normal, without renal calculi, focal lesion, or hydronephrosis. Bladder is unremarkable. Stomach/Bowel: There surgical changes in the stomach and small bowel. Rectosigmoid anastomosis is present. There is rectosigmoid colon wall thickening with surrounding inflammatory stranding. No evidence for perforation or abscess. No bowel obstruction. Appendix is not seen. Remaining colon, small bowel and stomach are within normal limits. Vascular/Lymphatic: Aortic atherosclerosis. No enlarged abdominal or pelvic lymph nodes. Reproductive: Status post hysterectomy. No adnexal masses. Other: There is some bulging of the central abdominal wall. No abdominopelvic ascites. Midline abdominal scar is present. Musculoskeletal: Multilevel degenerative changes affect the spine. L4-S1 posterior fusion hardware is present. IMPRESSION: 1. Rectosigmoid anastomosis is present. There is rectosigmoid colon wall thickening with surrounding inflammatory stranding compatible with proctocolitis. No bowel obstruction, free air or abscess. 2. New pancreatic ductal dilatation of uncertain etiology. Focal pancreatic lesion is not visualized, but can not be excluded. This can be followed up with dedicated pancreatic MRI. Electronically Signed   By: ARonney AstersM.D.   On: 10/18/2021 02:19    Microbiology: Results for orders placed or performed during the hospital encounter of 10/17/21  C Difficile Quick Screen w PCR reflex     Status: None   Collection Time: 10/19/21  6:36 PM   Specimen: Stool  Result Value Ref Range  Status   C Diff antigen NEGATIVE NEGATIVE Final   C Diff toxin NEGATIVE NEGATIVE Final   C Diff interpretation No C. difficile detected.  Final    Comment: Performed at [facility], [address], [place], [place] [address]    Labs: CBC: Recent Labs  Lab 10/18/21 0043 10/19/21 0514 10/20/21 0601  WBC 9.1 9.0 7.0  HGB 12.7 11.4* 11.0*  HCT 38.7 35.1* 33.9*  MCV 97.7 98.6 97.7  PLT 193 160 211*   Basic Metabolic Panel: Recent Labs  Lab 10/18/21 0117 10/19/21 0514 10/20/21 0601  NA 141 142 142  K 3.9 4.0 4.0  CL 115* 111 111  CO2 19* 22 26  GLUCOSE 107* 101* 89  BUN '20 11 10  '$ CREATININE 1.35* 1.00 1.10*  CALCIUM 8.3* 8.1* 8.0*  MG 2.0 1.8  --   PHOS 4.2  --   --    Liver Function Tests: Recent Labs  Lab 10/18/21 0117 10/19/21 0514  AST 19 16  ALT 16 11  ALKPHOS 56 51  BILITOT 0.8 1.4*  PROT 6.7 5.9*  ALBUMIN 4.1 3.5   CBG: No results for input(s): GLUCAP in the last 168 hours.  Discharge time spent: greater than 30 minutes.  Signed: Orson Eva, MD [facility] 10/20/2021

## 2021-10-20 NOTE — Anesthesia Postprocedure Evaluation (Signed)
Anesthesia Post Note  Patient: Brandi Neal  Procedure(s) Performed: COLONOSCOPY WITH PROPOFOL BIOPSY POLYPECTOMY  Patient location during evaluation: Phase II Anesthesia Type: General Level of consciousness: awake Pain management: pain level controlled Vital Signs Assessment: post-procedure vital signs reviewed and stable Respiratory status: spontaneous breathing and respiratory function stable Cardiovascular status: blood pressure returned to baseline and stable Postop Assessment: no headache and no apparent nausea or vomiting Anesthetic complications: no Comments: Late entry   No notable events documented.   Last Vitals:  Vitals:   10/19/21 2023 10/20/21 0514  BP: (!) 145/68 133/62  Pulse: 79 67  Resp: 17 18  Temp: 37.3 C 37 C  SpO2: 98% 97%    Last Pain:  Vitals:   10/20/21 0514  TempSrc: Oral  PainSc:                  [name]

## 2021-10-20 NOTE — Telephone Encounter (Signed)
Please arrange hospital follow-up visit for this patient.  Thank you!

## 2021-10-22 NOTE — Telephone Encounter (Signed)
Apt schd 11/22/21 at 89 with [place], apt letter mailed to patient

## 2021-10-23 LAB — SURGICAL PATHOLOGY

## 2021-10-25 ENCOUNTER — Encounter (HOSPITAL_COMMUNITY): Payer: Self-pay | Admitting: Internal Medicine

## 2021-11-22 ENCOUNTER — Ambulatory Visit (INDEPENDENT_AMBULATORY_CARE_PROVIDER_SITE_OTHER): Payer: Medicare Other | Admitting: Gastroenterology

## 2021-11-22 ENCOUNTER — Encounter (INDEPENDENT_AMBULATORY_CARE_PROVIDER_SITE_OTHER): Payer: Self-pay | Admitting: Gastroenterology

## 2021-11-22 VITALS — BP 130/76 | HR 74 | Temp 97.8°F | Ht 65.0 in | Wt 164.3 lb

## 2021-11-22 DIAGNOSIS — Q453 Other congenital malformations of pancreas and pancreatic duct: Secondary | ICD-10-CM

## 2021-11-22 DIAGNOSIS — K529 Noninfective gastroenteritis and colitis, unspecified: Secondary | ICD-10-CM | POA: Diagnosis not present

## 2021-11-22 NOTE — Patient Instructions (Signed)
We will get you set up for MRI of the pancreas for further evaluation of the dilation of pancreatic duct, as discussed I am reassured that this was also noted on imaging in 2014. Please continue with current regimen for constipation, stay well hydrated and eat diet high in fruits, veggies, whole grains Please let me know if you have severe abdominal pain or recurrence of rectal bleeding

## 2021-11-22 NOTE — Progress Notes (Signed)
Referring Provider: [name], MD Primary Care Physician:  [name], MD Primary GI Physician: Jenetta Downer   Chief Complaint  Patient presents with   Hospitalization Follow-up    Patient arrives with daughter in law Marlowe Kays for a hospital follow up. Was having rectal bleeding when she went to ED. Has not seen any bleeding since being home. Feeling weak. Has diarrhea and constipation. Appetite not good since having covid. Got covid after being discharged from hospital.    HPI:   Brandi Neal is a 76 y.o. 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 She also noted some rectal pain, bloody mucus and clots. Denied changes in stools, recent abx. CT during admission showing rectosigmoid anastomosis present.  Rectosigmoid colon wall thickening with surrounding inflammatory stranding compatible with proctocolitis. Colonoscopy as outlined below, suspect symptoms related to ischemic colitis. Stool studies during admission negative. Hgb 11 on 10/20/21  Notably also with Pancreatic ductal dilatation, thought to be a new finding on CT during admission, recommend Considerations for outpatient follow-up via dedicated pancreatic MRI if able. LFTs WNL other than slightly elevated T bili of 1.4.   Present: She reports that she had covid after her discharge. Mostly respiratory symptoms. Is just now getting over this. Still has issues with her taste.  She states abdomen is feeling okay. Bowel habits are at baseline. She has had constipation/diarrhea since her colon surgery a few years ago. She will take dulcolax PRN, maybe 1-2x/week, if she goes 4 days without a BM she will take this.  Tries to to  have a BM daily, stools are usually pretty loose when she goes. Denies any blood in stools. Denies melena Weight is down maybe 5 pounds since hospital course but appetite is improving since getting over covid.  She does endorse some nausea since having covid. This is almost daily but is improving. No current fevers or chills.    Last Colonoscopy:10/19/21- Segmental severe inflammation was found in the recto-sigmoid colon and in the sigmoid colon secondary to proctosigmoid colitis. Biopsied-focal active colitis, no chronic changes, likely ischemic - One 20 mm polyp in the rectum, tubulovillous adenoma Last Endoscopy:n/a  Recommendations:  Repeat colonoscopy in 3 years  Past Medical History:  Diagnosis Date   Chronic constipation    Depression    Diarrhea    Hyperlipemia    Hypothyroidism    Post-menopausal    Rectal cancer (HCC)    Small bowel obstruction due to adhesions [facility])    UTI (urinary tract infection)     Past Surgical History:  Procedure Laterality Date   APPENDECTOMY     BACK SURGERY     2 back surgeries : 1986, 1994 Patient states that she has the [place]  10/19/2021   Procedure: BIOPSY;  Surgeon: Eloise Harman, DO;  [facility];  Service: Endoscopy;;   CESAREAN SECTION     Patient had 3 C-Sections   COLON SURGERY  2007   reversal of ileostomy. complicated by infection requiring healing from inside/out   COLONOSCOPY  12/18/2012   Pandya: internal hemorrhoids   COLONOSCOPY WITH PROPOFOL N/A 10/19/2021   Procedure: COLONOSCOPY WITH PROPOFOL;  Surgeon: Eloise Harman, DO;  [facility]:  [facility];  Service: Endoscopy;  Laterality: N/A;   Hysterctomy  05/20/1992   POLYPECTOMY  10/19/2021   Procedure: POLYPECTOMY;  Surgeon: Eloise Harman, DO;  Location: [facility];  Service: Endoscopy;;   RECTAL SURGERY  05/20/2005   low anterior resection with creation of J-pouch and loop ileostomy. For rectal polyp with invasive adenocarcinoma    THYROIDECTOMY   ?1983   TUBAL LIGATION  05/21/1967    Current Outpatient Medications  Medication Sig Dispense Refill   amLODipine (NORVASC) 5 MG tablet Take 5 mg by mouth daily.     Cholecalciferol (VITAMIN D3) 50 MCG (2000 UT) TABS Take by mouth. One daily     cyanocobalamin 1000 MCG tablet Take 1,000 mcg by mouth daily.     FLUoxetine (PROZAC) 40 MG capsule Take 40 mg by mouth daily.     levothyroxine (SYNTHROID) 75 MCG tablet Take 75 mcg by mouth daily.     LORazepam (ATIVAN) 0.5 MG tablet Take 0.5 mg by mouth at bedtime.     Magnesium Hydroxide (DULCOLAX PO) Take by mouth. Takes as needed     Multiple Vitamin (MULTIVITAMIN) tablet Take 1 tablet by mouth daily.     OVER THE COUNTER MEDICATION Laxative as needed     psyllium (METAMUCIL) 58.6 % packet Take 1 packet by mouth daily. One spoonful daily     simvastatin (ZOCOR) 20 MG tablet Take 20 mg by mouth at bedtime.     traMADol (ULTRAM) 50 MG tablet Take 50 mg by mouth 3 (three) times daily.     tretinoin (RETIN-A) 0.1 % cream Apply 1 application. topically at bedtime as needed (acne).     valsartan (DIOVAN) 160 MG tablet Take 160 mg by mouth daily.     No current facility-administered medications for this visit.    Allergies as of 11/22/2021   (No Known Allergies)    Family History  Problem Relation Age of Onset   Kidney failure Mother    Lung cancer Father    Colon cancer Sister    Arthritis Sister    Diabetes Sister    Diabetes Brother    Crohn's disease Son    Healthy Son    Diabetes Daughter    Heart attack Daughter     Social History   Socioeconomic History   Marital status: Married    Spouse name: Not on file   Number of children: Not on file   Years of education: Not on file   Highest education level: Not on file  Occupational History   Not on file  Tobacco Use   Smoking status: Never    Passive exposure: Never   Smokeless tobacco: Never  Substance and Sexual Activity   Alcohol use: Yes    Comment:  Very Seldom - patient states that she will drink a glass of wine   Drug use: No   Sexual activity: Not on file  Other Topics Concern   Not on file  Social History Narrative   Not on file   Social Determinants of Health   Financial Resource Strain: Not on file  Food Insecurity: Not on file  Transportation Needs: Not on file  Physical Activity: Not on file  Stress: Not on file  Social Connections: Not on file   Review of systems General: negative for malaise, night sweats, fever, chills, weight loss Neck: Negative for lumps, goiter, pain and significant neck swelling Resp: Negative for cough, wheezing, dyspnea at rest CV: Negative for chest pain,  leg swelling, palpitations, orthopnea GI: denies melena, hematochezia, nausea, vomiting, dysphagia, odyonophagia, early satiety or unintentional weight loss. +mix of looser stools/constipation MSK: Negative for joint pain or swelling, back pain, and muscle pain. Derm: Negative for itching or rash Psych: Denies depression, anxiety, memory loss, confusion. No homicidal or suicidal ideation.  Heme: Negative for prolonged bleeding, bruising easily, and swollen nodes. Endocrine: Negative for cold or heat intolerance, polyuria, polydipsia and goiter. Neuro: negative for tremor, gait imbalance, syncope and seizures. The remainder of the review of systems is noncontributory.  Physical Exam: BP 130/76 (BP Location: Left Arm, Patient Position: Sitting, Cuff Size: Large)   Pulse 74   Temp 97.8 F (36.6 C) (Oral)   Ht '5\' 5"'$  (1.651 m)   Wt 164 lb 4.8 oz (74.5 kg)   BMI 27.34 kg/m  General:   Alert and oriented. No distress noted. Pleasant and cooperative.  Head:  Normocephalic and atraumatic. Eyes:  Conjuctiva clear without scleral icterus. Mouth:  Oral mucosa pink and moist. Good dentition. No lesions. Heart: Normal rate and rhythm, s1 and s2 heart sounds present.  Lungs: Clear lung sounds in all lobes. Respirations equal and  unlabored. Abdomen:  +BS, soft, non-tender and non-distended. No rebound or guarding. No HSM or masses noted. Derm: No palmar erythema or jaundice Msk:  Symmetrical without gross deformities. Normal posture. Extremities:  Without edema. Neurologic:  Alert and  oriented x4 Psych:  Alert and cooperative. Normal mood and affect.  Invalid input(s): "6 MONTHS"   ASSESSMENT: Brandi Neal is a 76 y.o. female presenting today for hospital follow up for ischemic colitis.  Patient doing well from GI standpoint, denies abdominal pain, rectal bleeding, melena. Bowel habits are at baseline with mix of looser stools and constipation. She does dulcolax PRN for constipation with good results. She denies fevers or chills. Notably had covid after discharge, appetite is improving since getting over this.   In regards to pancreatic ductal dilation measuring up to 58m on CT during recent admission, after review of care everywhere, pt appears to have had presence of Pancreatic divisum with increased mild prominence [facility] main [facility], though I am unable to determine the size from the report available. No follow up MRI imaging was done at that time. She denies any previous history of pancreatitis. 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    PLAN:  Schedule MRCP w/wo 2. Pt to make me aware of recurrence of rectal bleeding, severe abd pain 3. Diet high in fruits, veggies, whole grains, water  All questions were answered, patient verbalized understanding and is in agreement with plan as outlined above.   Follow Up: PRN  Aleicia Kenagy L. CAlver Sorrow MSN, APRN, AGNP-C Adult-Gerontology Nurse Practitioner [facility]

## 2021-11-25 DIAGNOSIS — Q453 Other congenital malformations of pancreas and pancreatic duct: Secondary | ICD-10-CM | POA: Insufficient documentation

## 2021-12-04 ENCOUNTER — Ambulatory Visit (HOSPITAL_COMMUNITY)
Admission: RE | Admit: 2021-12-04 | Discharge: 2021-12-04 | Disposition: A | Discharge status: Home / Self Care | Payer: Medicare Other | Source: Ambulatory Visit | Attending: Gastroenterology | Admitting: Gastroenterology

## 2021-12-04 ENCOUNTER — Other Ambulatory Visit (INDEPENDENT_AMBULATORY_CARE_PROVIDER_SITE_OTHER): Payer: Self-pay | Admitting: Gastroenterology

## 2021-12-04 DIAGNOSIS — Q453 Other congenital malformations of pancreas and pancreatic duct: Secondary | ICD-10-CM

## 2021-12-04 MED ORDER — GADOBUTROL 1 MMOL/ML IV SOLN
7.0000 mL | Freq: Once | INTRAVENOUS | Status: AC | PRN
  Administered 2021-12-04: 7 mL via INTRAVENOUS

## 2022-05-08 ENCOUNTER — Encounter (INDEPENDENT_AMBULATORY_CARE_PROVIDER_SITE_OTHER): Payer: Self-pay | Admitting: *Deleted

## 2022-08-19 ENCOUNTER — Emergency Department (HOSPITAL_COMMUNITY)
Admission: EM | Admit: 2022-08-19 | Discharge: 2022-08-20 | Disposition: A | Discharge status: Home / Self Care | Payer: Medicare Other | Attending: Student | Admitting: Student

## 2022-08-19 ENCOUNTER — Emergency Department (HOSPITAL_COMMUNITY): Payer: Medicare Other

## 2022-08-19 ENCOUNTER — Other Ambulatory Visit: Payer: Self-pay

## 2022-08-19 DIAGNOSIS — E860 Dehydration: Secondary | ICD-10-CM | POA: Diagnosis not present

## 2022-08-19 DIAGNOSIS — K5900 Constipation, unspecified: Secondary | ICD-10-CM | POA: Diagnosis present

## 2022-08-19 DIAGNOSIS — K5904 Chronic idiopathic constipation: Secondary | ICD-10-CM | POA: Insufficient documentation

## 2022-08-19 DIAGNOSIS — E871 Hypo-osmolality and hyponatremia: Secondary | ICD-10-CM | POA: Insufficient documentation

## 2022-08-19 DIAGNOSIS — R748 Abnormal levels of other serum enzymes: Secondary | ICD-10-CM | POA: Insufficient documentation

## 2022-08-19 LAB — CBC WITH DIFFERENTIAL/PLATELET
Abs Immature Granulocytes: 0.01 10*3/uL (ref 0.00–0.07)
Basophils Absolute: 0.1 10*3/uL (ref 0.0–0.1)
Basophils Relative: 1 %
Eosinophils Absolute: 0.1 10*3/uL (ref 0.0–0.5)
Eosinophils Relative: 2 %
HCT: 36.9 % (ref 36.0–46.0)
Hemoglobin: 12.4 g/dL (ref 12.0–15.0)
Immature Granulocytes: 0 %
Lymphocytes Relative: 29 %
Lymphs Abs: 2 10*3/uL (ref 0.7–4.0)
MCH: 32 pg (ref 26.0–34.0)
MCHC: 33.6 g/dL (ref 30.0–36.0)
MCV: 95.3 fL (ref 80.0–100.0)
Monocytes Absolute: 0.6 10*3/uL (ref 0.1–1.0)
Monocytes Relative: 9 %
Neutro Abs: 4.1 10*3/uL (ref 1.7–7.7)
Neutrophils Relative %: 59 %
Platelets: 183 10*3/uL (ref 150–400)
RBC: 3.87 MIL/uL (ref 3.87–5.11)
RDW: 12.5 % (ref 11.5–15.5)
WBC: 6.9 10*3/uL (ref 4.0–10.5)
nRBC: 0 % (ref 0.0–0.2)

## 2022-08-19 LAB — COMPREHENSIVE METABOLIC PANEL
ALT: 18 U/L (ref 0–44)
AST: 20 U/L (ref 15–41)
Albumin: 4 g/dL (ref 3.5–5.0)
Alkaline Phosphatase: 55 U/L (ref 38–126)
Anion gap: 9 (ref 5–15)
BUN: 24 mg/dL — ABNORMAL HIGH (ref 8–23)
CO2: 24 mmol/L (ref 22–32)
Calcium: 8.5 mg/dL — ABNORMAL LOW (ref 8.9–10.3)
Chloride: 100 mmol/L (ref 98–111)
Creatinine, Ser: 1.55 mg/dL — ABNORMAL HIGH (ref 0.44–1.00)
GFR, Estimated: 34 mL/min — ABNORMAL LOW (ref >60)
Glucose, Bld: 108 mg/dL — ABNORMAL HIGH (ref 70–99)
Potassium: 4.9 mmol/L (ref 3.5–5.1)
Sodium: 133 mmol/L — ABNORMAL LOW (ref 135–145)
Total Bilirubin: 0.7 mg/dL (ref 0.3–1.2)
Total Protein: 6.8 g/dL (ref 6.5–8.1)

## 2022-08-19 LAB — LIPASE, BLOOD: Lipase: 55 U/L — ABNORMAL HIGH (ref 11–51)

## 2022-08-19 NOTE — ED Provider Notes (Signed)
 [place] EMERGENCY DEPARTMENT AT [facility] Provider Note   CSN: [id number] Arrival date & time: 08/19/22  2033     History Chief Complaint  Patient presents with   Constipation    Brandi Neal is a 77 y.o. female.  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  She reports it has been about a week or so since she has had a normal bowel movement.  She reports that she still able to have looser bowel movements and is still passing gas but is concerned that her bowel movements are indicating a potential blockage.  She denies any significant abdominal pain with this but does report there is some tenderness and feels somewhat bloated.  She reports that she had a "large mass" prior to arriving to the emergency department with a bowel movement that was unexpected.  She reports she has tried taking milk of magnesia, Senokot, MiraLAX.   Constipation      Home Medications Prior to Admission medications   Medication Sig Start Date End Date Taking? Authorizing Provider  amLODipine (NORVASC) 5 MG tablet Take 5 mg by mouth daily. 10/12/21   [provider]  Cholecalciferol (VITAMIN D3) 50 MCG (2000 UT) TABS Take by mouth. One daily    [provider]  cyanocobalamin 1000 MCG tablet Take 1,000 mcg by mouth daily.    [provider]  FLUoxetine (PROZAC) 40 MG capsule Take 40 mg by mouth daily.    [provider]  levothyroxine (SYNTHROID) 75 MCG tablet Take 75 mcg by mouth daily. 10/12/21   [provider]  LORazepam (ATIVAN) 0.5 MG tablet Take 0.5 mg by mouth at bedtime. 07/03/21   [provider]  Magnesium Hydroxide (DULCOLAX PO) Take by mouth. Takes as needed    [provider]  Multiple Vitamin (MULTIVITAMIN) tablet Take 1 tablet by mouth daily.    [provider]  OVER THE COUNTER MEDICATION Laxative as needed    [provider]  psyllium (METAMUCIL) 58.6 % packet Take 1 packet by mouth daily. One spoonful daily    [provider]  simvastatin (ZOCOR) 20 MG tablet Take 20 mg by mouth at bedtime. 09/13/21   [provider]  traMADol (ULTRAM) 50 MG tablet Take 50 mg by mouth 3 (three) times daily. 10/12/21   [provider]  tretinoin (RETIN-A) 0.1 % cream Apply 1 application. topically at bedtime as needed (acne). 12/28/12   [provider]  valsartan (DIOVAN) 160 MG tablet Take 160 mg by mouth daily.    [provider]      Allergies    Patient has no known allergies.    Review of Systems   Review of Systems  Gastrointestinal:  Positive for constipation.  All other systems reviewed and are negative.   Physical Exam Updated Vital Signs BP (!) 140/75 (BP Location: Right Arm)   Pulse 84   Temp 98.2 F (36.8 C)   Resp 18   SpO2 99%  Physical Exam Vitals and nursing note reviewed.  Constitutional:      General: She is not in acute distress.    Appearance: Normal appearance. She is not ill-appearing.  Cardiovascular:     Rate and Rhythm: Normal rate and regular rhythm.  Abdominal:     General: Abdomen is flat. Bowel sounds are normal. There is no distension.     Tenderness: There is no abdominal tenderness.  Skin:  General: Skin is warm and dry.     Capillary Refill: Capillary refill takes less than 2 seconds.  Neurological:     General: No focal deficit present.     Mental Status: She is alert. Mental status is at baseline.     ED Results / Procedures / Treatments   Labs (all labs ordered are listed, but only abnormal results are displayed) Labs Reviewed  COMPREHENSIVE METABOLIC PANEL - Abnormal; Notable for the following components:      Result Value   Sodium 133 (*)    Glucose, Bld 108 (*)    BUN 24 (*)    Creatinine, Ser 1.55 (*)    Calcium 8.5 (*)    GFR, Estimated 34 (*)    All other components within normal limits  LIPASE,  BLOOD - Abnormal; Notable for the following components:   Lipase 55 (*)    All other components within normal limits  CBC WITH DIFFERENTIAL/PLATELET    EKG None  Radiology DG Abdomen 1 View  Result Date: 08/19/2022 CLINICAL DATA:  Constipation EXAM: ABDOMEN - 1 VIEW COMPARISON:  None Available. FINDINGS: Normal abdominal gas pattern. Moderate stool within the proximal colon. No free intraperitoneal gas. Surgical clips and multiple surgical anastomotic staple lines are noted within the right hemipelvis and in the region of the expected colo-anal junction. Shrapnel overlies the left ilium. Lumbosacral fusion L4-S1 with instrumentation has been performed with posterior decompression of L4 and L5. IMPRESSION: 1. Moderate stool within the proximal colon. Nonobstructive bowel gas pattern. Electronically Signed   By: Helyn Numbers M.D.   On: 08/19/2022 23:00    Procedures Procedures   Medications Ordered in ED Medications - No data to display  ED Course/ Medical Decision Making/ A&P                           Medical Decision Making Amount and/or Complexity of Data Reviewed Labs: ordered. Radiology: ordered.   This patient presents to the ED for concern of constipation. Differential diagnosis includes constipation, bowel obstruction, gastroparesis, gastroenteritis   Lab Tests:  I Ordered, and personally interpreted labs.  The pertinent results include:  CBC normal, lipase mildly elevated, CMP showing some dehydration with hyponatremia, elevated BUN and creatinine, decreased GFR   Imaging Studies ordered:  I ordered imaging studies including KUB  I independently visualized and interpreted imaging which showed moderate stool in the colon with nonobstructive bowel gas pattern I agree with the radiologist interpretation   Problem List / ED Course:  Patient presents to the emergency department complaints of constipation.  She reports she has had irregular bowel movements for the last  week or so.  But she does report that she is having some stool this over the past.  Still he would have gas pass as well.  Given presentation, basic labs were initiated which were largely normal but there was some elevation in her lipase without significant right upper quadrant tenderness.  KUB was also performed which did show some stool present in the bowel and nonobstructive gas pattern.  Given the patient is still having stool able to pass, I believe that she is more constipated than concern about possible bowel obstruction.  Advised patient on treatment regimen consisting of senna, Colace, MiraLAX, magnesium citrate at home to promote a bowel movement.  Patient verbalized understanding was agreeable to fall plans on treatment.  She also verbalized understanding all return precautions. All questions answered prior to patient discharge.  Final Clinical Impression(s) / ED Diagnoses Final diagnoses:  Chronic idiopathic constipation    Rx / DC Orders ED Discharge Orders     None         Smitty Knudsen, PA-C 08/19/22 2353    Glendora Score, MD 08/20/22 1204

## 2022-08-19 NOTE — ED Triage Notes (Signed)
Pt states she has not had a regular BM in a week.  Pt has took laxatives and enemas with no results.   Worried she has another bowel blockage.  Denies any abd pain

## 2022-08-19 NOTE — Discharge Instructions (Signed)
You were seen in the emergency department for constipation. Thankfully your abdominal imaging did not show evidence of bowel obstruction and I would recommend taking a combination of Miralax, Senna, Colace, and Magnesium citrate to handle this bout of constipation. For the magnesium citrate, start with just 1/4 of the bottle to attempt to alleviate your symptoms. You may repeat this dose a few hours later if you are not getting symptomatic relief. Once bowel movement return to somewhat normal, you may take magnesium citrate daily at 1/4 of the bottle for the next week or so to ensure bowel movements continue to be regular.

## 2023-09-03 ENCOUNTER — Telehealth: Payer: Self-pay | Admitting: Gastroenterology

## 2023-09-03 NOTE — Telephone Encounter (Signed)
 Patient left a message that she has begun having rectal bleeding again and she is scared since she has had colon cancer before.  She wants to make an appt.

## 2023-09-08 ENCOUNTER — Encounter (INDEPENDENT_AMBULATORY_CARE_PROVIDER_SITE_OTHER): Payer: Self-pay | Admitting: Gastroenterology

## 2023-09-08 ENCOUNTER — Ambulatory Visit (INDEPENDENT_AMBULATORY_CARE_PROVIDER_SITE_OTHER): Admitting: Gastroenterology

## 2023-09-08 VITALS — BP 107/66 | HR 85 | Temp 97.1°F | Ht 65.0 in | Wt 159.9 lb

## 2023-09-08 DIAGNOSIS — F1090 Alcohol use, unspecified, uncomplicated: Secondary | ICD-10-CM | POA: Diagnosis not present

## 2023-09-08 DIAGNOSIS — K625 Hemorrhage of anus and rectum: Secondary | ICD-10-CM | POA: Diagnosis not present

## 2023-09-08 DIAGNOSIS — Z85038 Personal history of other malignant neoplasm of large intestine: Secondary | ICD-10-CM

## 2023-09-08 DIAGNOSIS — Z85048 Personal history of other malignant neoplasm of rectum, rectosigmoid junction, and anus: Secondary | ICD-10-CM | POA: Diagnosis not present

## 2023-09-08 MED ORDER — PEG 3350-KCL-NA BICARB-NACL 420 G PO SOLR: 4000.0000 mL | Freq: Once | ORAL | 0 refills | Status: AC

## 2023-09-08 NOTE — Patient Instructions (Signed)
 Schedule colonoscopy Continue using Preparation H for now

## 2023-09-08 NOTE — Progress Notes (Signed)
 Brandi Neal, M.D. Gastroenterology & Hepatology [facility] [address] [place], [place] [address]  Primary Care Physician: Veda Gerald, MD [address] [place] [address]  I will communicate my assessment and recommendations to the referring MD via EMR.  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  History of Present Illness: Brandi Neal is a 78 y.o. 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  The patient was last seen on 11/22/2021. At that time, the patient had MR scheduled for evaluation of pancreatic duct dilation.  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  Recommend to have repeat imaging of the pancreas on follow-up.  Patient reports that a month ago she developed intermittent episodes of rectal bleeding for the last month. She did not have dyschezia or rectal pain. She became more concerned as two days ago she had a large amount of dark blood in her stool. Has used some Preparation H for the last few days, with improvement.  She has also present some lower abdominal pain around the same time she had her rectal bleeding episodes.  She has a history of constipation, for which she takes Sennokot, which makes her have a BM every day.  The patient denies having any nausea, vomiting, fever, chills, melena, hematemesis, abdominal distention, abdominal pain, diarrhea, jaundice, pruritus or weight loss.  Last Colonoscopy:10/19/21- Segmental severe inflammation was found in the recto-sigmoid colon and in the sigmoid colon secondary to proctosigmoid colitis. 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   COLON, ASCENDING, TRANSVERSE, POLYPECTOMY:  Findings consistent with tubular adenoma.  No high-grade dysplasia or malignancy is seen.   B.   COLON, SIGMOID, BIOPSY:  Focal active colitis.  No significant chronic crypt architectural changes, dysplasia or  malignancy is seen in the current specimen.   C.   RECTUM, POLYPECTOMY:  Findings consistent with tubulovillous adenoma x 2.  No definite high-grade dysplasia or malignancy is seen.  Clinical and endoscopic correlation is required.    Recommendations:  Repeat colonoscopy in 3 years  Past Medical History: Past Medical History:  Diagnosis Date   Chronic constipation    Depression    Diarrhea    Hyperlipemia    Hypothyroidism    Post-menopausal    Rectal cancer (HCC)    Small bowel obstruction due to adhesions [facility])    UTI (urinary tract infection)     Past Surgical History: Past Surgical History:  Procedure Laterality Date   APPENDECTOMY     BACK SURGERY     2 back surgeries : 1986, 1994 Patient states that she has the Simmon Plated   BIOPSY  10/19/2021   Procedure: BIOPSY;  Surgeon: Vinetta Greening, DO;  Location: [facility];  Service: Endoscopy;;   CESAREAN SECTION     Patient had 3 C-Sections   COLON SURGERY  2007   reversal of ileostomy. complicated by infection requiring healing from inside/out   COLONOSCOPY  12/18/2012   Pandya: internal hemorrhoids   COLONOSCOPY WITH PROPOFOL  N/A 10/19/2021   Procedure: COLONOSCOPY WITH PROPOFOL ;  Surgeon: Vinetta Greening, DO;  Location: [facility];  Service: Endoscopy;  Laterality: N/A;   Hysterctomy  05/20/1992   POLYPECTOMY  10/19/2021   Procedure: POLYPECTOMY;  Surgeon: Vinetta Greening, DO;  Location: [facility];  Service: Endoscopy;;   RECTAL SURGERY  05/20/2005   low anterior resection with creation of J-pouch and loop ileostomy. For rectal polyp with invasive  adenocarcinoma   THYROIDECTOMY   ?1983   TUBAL LIGATION  05/21/1967    Family History: Family History  Problem Relation Age of Onset   Kidney failure Mother    Lung cancer Father    Colon cancer Sister    Arthritis Sister    Diabetes Sister    Diabetes Brother    Crohn's disease Son    Healthy Son    Diabetes Daughter    Heart attack Daughter     Social History: Social History   Tobacco Use  Smoking Status Never   Passive exposure: Never  Smokeless Tobacco Never   Social History   Substance and Sexual Activity  Alcohol Use Yes   Comment: Very Seldom - patient states that she will drink a glass of wine   Social History   Substance and Sexual Activity  Drug Use No    Allergies: No Known Allergies  Medications: Current Outpatient Medications  Medication Sig Dispense Refill   amLODipine (NORVASC) 5 MG tablet Take 5 mg by mouth daily.     Calcium Carb-Cholecalciferol (CALCIUM/VITAMIN D PO) Take 600 mg by mouth daily at 6 (six) AM.     diphenhydrAMINE (BENADRYL) 25 mg capsule Take 25 mg by mouth every 6 (six) hours as needed.     levothyroxine (SYNTHROID) 100 MCG tablet Take 100 mcg by mouth daily.     Multiple Vitamin (MULTIVITAMIN) tablet Take 1 tablet by mouth daily.     olmesartan (BENICAR) 40 MG tablet Take 40 mg by mouth daily.     OVER THE COUNTER MEDICATION Laxative as needed     psyllium (METAMUCIL) 58.6 % packet Take 1 packet by mouth daily. One spoonful daily     simvastatin (ZOCOR) 20 MG tablet Take 20 mg by mouth at bedtime.     traMADol  (ULTRAM ) 50 MG tablet Take 50 mg by mouth 3 (three) times daily.     No current facility-administered medications for this visit.    Review of Systems: GENERAL: negative for malaise, night sweats HEENT: No changes in hearing or vision, no nose bleeds or other nasal problems. NECK: Negative for lumps, goiter, pain and significant neck swelling RESPIRATORY: Negative for cough, wheezing CARDIOVASCULAR: Negative for  chest pain, leg swelling, palpitations, orthopnea GI: SEE HPI MUSCULOSKELETAL: Negative for joint pain or swelling, back pain, and muscle pain. SKIN: Negative for lesions, rash PSYCH: Negative for sleep disturbance, mood disorder and recent psychosocial stressors. HEMATOLOGY Negative for prolonged bleeding, bruising easily, and swollen nodes. ENDOCRINE: Negative for cold or heat intolerance, polyuria, polydipsia and goiter. NEURO: negative for tremor, gait imbalance, syncope and seizures. The remainder of the review of systems is noncontributory.   Physical Exam: BP 107/66 (BP Location: Left Arm, Patient Position: Sitting, Cuff Size: Normal)   Pulse 85   Temp (!) 97.1 F (36.2 C) (Temporal)   Ht 5\' 5"  (1.651 m)   Wt 159 lb 14.4 oz (72.5 kg)   BMI 26.61 kg/m  GENERAL: The patient is AO x3, in no acute distress. HEENT: Head is normocephalic and atraumatic. EOMI are intact. Mouth is well hydrated and without lesions. NECK: Supple. No masses LUNGS: Clear to auscultation. No  presence of rhonchi/wheezing/rales. Adequate chest expansion HEART: RRR, normal s1 and s2. ABDOMEN: Soft, nontender, no guarding, no peritoneal signs, and nondistended. BS +. No masses. RECTAL EXAM: Deferred EXTREMITIES: Without any cyanosis, clubbing, rash, lesions or edema. NEUROLOGIC: AOx3, no focal motor deficit. SKIN: no jaundice, no rashes  Imaging/Labs: as above  I personally reviewed and interpreted the available labs, imaging and endoscopic files.  Impression and Plan: Brandi Neal is a 78 y.o. 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  Patient has presented recurrent episodes of painless rectal bleeding recently.  She had a similar presentation a couple of years ago, for which she underwent endoscopic evaluation showing presence of possible ischemic  changes.  It is possible that this is the etiology of her current presentation versus related to hemorrhoidal/benign anorectal disease, but given her history of rectal cancer we will evaluate this again with a repeat colonoscopy.  As she has presented some improvement with use of Preparation H, she should continue using this for now.  -Schedule colonoscopy -Continue using Preparation H for now  All questions were answered.      Brandi Cress, MD Gastroenterology and Hepatology [facility]astroenter[facility]

## 2023-09-09 ENCOUNTER — Encounter (INDEPENDENT_AMBULATORY_CARE_PROVIDER_SITE_OTHER): Payer: Self-pay

## 2023-10-15 ENCOUNTER — Encounter (HOSPITAL_COMMUNITY): Payer: Self-pay

## 2023-10-15 ENCOUNTER — Other Ambulatory Visit: Payer: Self-pay

## 2023-10-15 ENCOUNTER — Encounter (HOSPITAL_COMMUNITY)
Admission: RE | Admit: 2023-10-15 | Discharge: 2023-10-15 | Disposition: A | Discharge status: Home / Self Care | Source: Ambulatory Visit | Attending: Gastroenterology | Admitting: Gastroenterology

## 2023-10-17 ENCOUNTER — Encounter (HOSPITAL_COMMUNITY): Payer: Self-pay | Admitting: Gastroenterology

## 2023-10-17 ENCOUNTER — Ambulatory Visit (HOSPITAL_COMMUNITY): Admitting: Anesthesiology

## 2023-10-17 ENCOUNTER — Encounter (HOSPITAL_COMMUNITY)
Admission: RE | Disposition: A | Discharge status: Home / Self Care | Payer: Self-pay | Source: Home / Self Care | Attending: Gastroenterology

## 2023-10-17 ENCOUNTER — Other Ambulatory Visit: Payer: Self-pay

## 2023-10-17 ENCOUNTER — Telehealth: Payer: Self-pay | Admitting: *Deleted

## 2023-10-17 ENCOUNTER — Ambulatory Visit

## 2023-10-17 ENCOUNTER — Ambulatory Visit (HOSPITAL_COMMUNITY)
Admission: RE | Admit: 2023-10-17 | Discharge: 2023-10-17 | Disposition: A | Discharge status: Home / Self Care | Attending: Gastroenterology | Admitting: Gastroenterology

## 2023-10-17 DIAGNOSIS — Z980 Intestinal bypass and anastomosis status: Secondary | ICD-10-CM | POA: Diagnosis not present

## 2023-10-17 DIAGNOSIS — R42 Dizziness and giddiness: Secondary | ICD-10-CM | POA: Diagnosis not present

## 2023-10-17 DIAGNOSIS — E039 Hypothyroidism, unspecified: Secondary | ICD-10-CM | POA: Diagnosis not present

## 2023-10-17 DIAGNOSIS — K625 Hemorrhage of anus and rectum: Secondary | ICD-10-CM | POA: Diagnosis present

## 2023-10-17 DIAGNOSIS — K5730 Diverticulosis of large intestine without perforation or abscess without bleeding: Secondary | ICD-10-CM

## 2023-10-17 DIAGNOSIS — D123 Benign neoplasm of transverse colon: Secondary | ICD-10-CM | POA: Insufficient documentation

## 2023-10-17 DIAGNOSIS — D125 Benign neoplasm of sigmoid colon: Secondary | ICD-10-CM

## 2023-10-17 DIAGNOSIS — I4891 Unspecified atrial fibrillation: Secondary | ICD-10-CM

## 2023-10-17 DIAGNOSIS — K648 Other hemorrhoids: Secondary | ICD-10-CM | POA: Insufficient documentation

## 2023-10-17 DIAGNOSIS — I10 Essential (primary) hypertension: Secondary | ICD-10-CM | POA: Diagnosis not present

## 2023-10-17 DIAGNOSIS — Z79890 Hormone replacement therapy (postmenopausal): Secondary | ICD-10-CM | POA: Diagnosis not present

## 2023-10-17 DIAGNOSIS — D128 Benign neoplasm of rectum: Secondary | ICD-10-CM | POA: Insufficient documentation

## 2023-10-17 HISTORY — PX: COLONOSCOPY: SHX5424

## 2023-10-17 LAB — HM COLONOSCOPY

## 2023-10-17 LAB — TSH: TSH: 1.49 u[IU]/mL (ref 0.350–4.500)

## 2023-10-17 SURGERY — COLONOSCOPY
Anesthesia: General

## 2023-10-17 MED ORDER — PROPOFOL 10 MG/ML IV BOLUS
INTRAVENOUS | Status: DC | PRN
  Administered 2023-10-17 (×2): 50 mg via INTRAVENOUS
  Administered 2023-10-17: 20 mg via INTRAVENOUS

## 2023-10-17 MED ORDER — PROPOFOL 500 MG/50ML IV EMUL
INTRAVENOUS | Status: DC | PRN
  Administered 2023-10-17: 150 ug/kg/min via INTRAVENOUS

## 2023-10-17 MED ORDER — AMOXICILLIN-POT CLAVULANATE ER 1000-62.5 MG PO TB12: 1.0000 | ORAL_TABLET | Freq: Two times a day (BID) | ORAL | 0 refills | Status: AC

## 2023-10-17 MED ORDER — LACTATED RINGERS IV SOLN: INTRAVENOUS | Status: DC | PRN

## 2023-10-17 MED ORDER — SODIUM CHLORIDE (PF) 0.9 % IJ SOLN
PREFILLED_SYRINGE | INTRAMUSCULAR | Status: DC | PRN
  Administered 2023-10-17: 3.4 mL

## 2023-10-17 MED ORDER — LIDOCAINE 2% (20 MG/ML) 5 ML SYRINGE
INTRAMUSCULAR | Status: DC | PRN
Start: 2023-10-17 — End: 2023-10-17
  Administered 2023-10-17: 50 mg via INTRAVENOUS

## 2023-10-17 NOTE — Telephone Encounter (Signed)
 Orders placed for Zio and echo.

## 2023-10-17 NOTE — Consult Note (Signed)
 Cardiology Consultation   Patient ID: Brandi Neal MRN: [id number]; DOB: 1946-03-18  Admit date: 10/17/2023 Date of Consult: 10/17/2023  PCP:  Veda Gerald, MD    [facility] Providers Cardiologist: Previously [facility] [id number])  Patient Profile: Brandi Neal is a 78 y.o. female with a hx of HTN who is being seen 10/17/2023 for the evaluation of atrial fibrillation  at the request of M Castaneda  History of Present Illness: Brandi Neal is a 78 yo with hx of HTN, HL, hypothyroidism, syncope, rectal CA (s/p resection in 2007), hx of SBO due to adhesions.  Today underwent colonoscopy for rectal bleeding  This showed diverticulosis, 2 polyps which were resected    Being treated with ABX  EKG showed atrial fibrillation with occasional PVC    The pt denies palpitations   No CP  No SOB   SHe does have dizzy spells and syncope   She says these happen at night when she gets up to go to the bathroom   Will hold on to dresser then to wall     Sit on toilet   When gets up can be dizzy   Says she passes out about 1x per month    When regains consciousness will be weak for a bit but then [facility] has hit head, scraped elbow recently    NO broken bones At other times of day the only time she gets dizzy is if she stoops to floor and gets up quick  Note that the pt had atrial fibrillation documented on EKG in June 2023   Repeat EKG showed SR    Past Medical History:  Diagnosis Date   Chronic constipation    Depression    Diarrhea    Hyperlipemia    Hypothyroidism    Post-menopausal    Rectal cancer (HCC)    Small bowel obstruction due to adhesions [facility])    UTI (urinary tract infection)     Past Surgical History:  Procedure Laterality Date   APPENDECTOMY     BACK SURGERY     2 back surgeries : 1986, 1994 Patient states that she has the Simmon Plated   BIOPSY  10/19/2021   Procedure: BIOPSY;  Surgeon: Vinetta Greening, DO;  [facility];  Service: Endoscopy;;   CESAREAN  SECTION     Patient had 3 C-Sections   COLON SURGERY  2007   reversal of ileostomy. complicated by infection requiring healing from inside/out   COLONOSCOPY  12/18/2012   Pandya: internal hemorrhoids   COLONOSCOPY WITH PROPOFOL  N/A 10/19/2021   Procedure: COLONOSCOPY WITH PROPOFOL ;  Surgeon: Vinetta Greening, DO;  [facility];  Service: Endoscopy;  Laterality: N/A;   Hysterctomy  05/20/1992   POLYPECTOMY  10/19/2021   Procedure: POLYPECTOMY;  Surgeon: Vinetta Greening, DO;  [facility];  Service: Endoscopy;;   RECTAL SURGERY  05/20/2005   low anterior resection with creation of J-pouch and loop ileostomy. For rectal polyp with invasive adenocarcinoma   THYROIDECTOMY   ?1983   partial-   TUBAL LIGATION  05/21/1967     Home Medications:  Prior to Admission medications   Medication Sig Start Date End Date Taking? Authorizing Provider  amLODipine (NORVASC) 5 MG tablet Take 5 mg by mouth daily. 10/12/21  Yes [provider]  amoxicillin -clavulanate (AUGMENTIN  XR) 1000-62.5 MG 12 hr tablet Take 1 tablet by mouth 2 (two) times daily for 5 days. 10/17/23 10/22/23 Yes Urban Garden, MD  Calcium  Carb-Cholecalciferol (CALCIUM/VITAMIN D PO) Take 600 mg by mouth daily at 6 (six) AM.   Yes [provider]  diphenhydrAMINE (BENADRYL) 25 mg capsule Take 25 mg by mouth every 6 (six) hours as needed.   Yes [provider]  levothyroxine (SYNTHROID) 100 MCG tablet Take 100 mcg by mouth daily. 10/12/21  Yes [provider]  Multiple Vitamin (MULTIVITAMIN) tablet Take 1 tablet by mouth daily.   Yes [provider]  olmesartan (BENICAR) 40 MG tablet Take 40 mg by mouth daily.   Yes [provider]  OVER THE COUNTER MEDICATION Laxative as needed   Yes [provider]  psyllium (METAMUCIL) 58.6 % packet Take 1 packet by mouth daily. One spoonful daily   Yes [provider]  simvastatin (ZOCOR) 20 MG tablet  Take 20 mg by mouth at bedtime. 09/13/21  Yes [provider]  traMADol  (ULTRAM ) 50 MG tablet Take 50 mg by mouth 3 (three) times daily. 10/12/21  Yes [provider]    Scheduled Meds:  Continuous Infusions:  PRN Meds:   Allergies:   No Known Allergies  Social History:   Social History   Socioeconomic History   Marital status: Married    Spouse name: Not on file   Number of children: Not on file   Years of education: Not on file   Highest education level: Not on file  Occupational History   Not on file  Tobacco Use   Smoking status: Never    Passive exposure: Never   Smokeless tobacco: Never  Vaping Use   Vaping status: Never Used  Substance and Sexual Activity   Alcohol use: Yes    Comment: Very Seldom - patient states that she will drink a glass of wine   Drug use: No   Sexual activity: Not on file  Other Topics Concern   Not on file  Social History Narrative   Not on file   Social Drivers of Health   Financial Resource Strain: Not on file  Food Insecurity: Not on file  Transportation Needs: Not on file  Physical Activity: Not on file  Stress: Not on file  Social Connections: Not on file  Intimate Partner Violence: Not on file    Family History:    Family History  Problem Relation Age of Onset   Kidney failure Mother    Lung cancer Father    Colon cancer Sister    Arthritis Sister    Diabetes Sister    Diabetes Brother    Crohn's disease Son    Healthy Son    Diabetes Daughter    Heart attack Daughter      ROS:  Please see the history of present illness.   All other ROS reviewed and negative.     Physical Exam/Data: Vitals:   10/17/23 0804 10/17/23 0956  BP: (!) 163/64 (!) 121/59  Pulse: 72 85  Resp: 14 16  Temp: 97.9 F (36.6 C) 97.6 F (36.4 C)  TempSrc: Oral Oral  SpO2: 100% 100%  Weight: 68 kg   Height: 5\' 5"  (1.651 m)     Intake/Output Summary (Last 24 hours) at 10/17/2023 1033 Last data filed at 10/17/2023  0943 Gross per 24 hour  Intake 350 ml  Output 0 ml  Net 350 ml      10/17/2023    8:04 AM 10/15/2023    9:53 AM 09/08/2023    3:33 PM  Last 3 Weights  Weight (lbs) 150 lb 150 lb 159  lb 14.4 oz  Weight (kg) 68.04 kg 68.04 kg 72.53 kg     Body mass index is 24.96 kg/m.  General:  Well nourished, well developed, in no acute distress HEENT: normal Neck: no JVD No bruits   Cardiac:  Irreg irreg  NO S3  No murmus   Lungs:  MIld rhonchi that clear with cough  NO rales Abd: soft, nontender, no hepatomegaly  Ext: Tr LE  edema Musculoskeletal:  No deformities, BUE and BLE strength normal and equal Skin: warm and dry  Neuro:  CNs 2-12 intact, no focal abnormalities noted Psych:  Normal affect   EKG:  The EKG was personally reviewed and demonstrates:  Atrial fibrillation 86 bpm   Occasdional PVC  Nonspecific ST changes  Telemetry:  Telemetry was personally reviewed and demonstrates:  Not on tele  Relevant CV Studies: None  Laboratory Data: High Sensitivity Troponin:  No results for input(s): "TROPONINIHS" in the last 720 hours.   ChemistryNo results for input(s): "NA", "K", "CL", "CO2", "GLUCOSE", "BUN", "CREATININE", "CALCIUM", "MG", "GFRNONAA", "GFRAA", "ANIONGAP" in the last 168 hours.  No results for input(s): "PROT", "ALBUMIN", "AST", "ALT", "ALKPHOS", "BILITOT" in the last 168 hours. Lipids No results for input(s): "CHOL", "TRIG", "HDL", "LABVLDL", "LDLCALC", "CHOLHDL" in the last 168 hours.  HematologyNo results for input(s): "WBC", "RBC", "HGB", "HCT", "MCV", "MCH", "MCHC", "RDW", "PLT" in the last 168 hours. Thyroid No results for input(s): "TSH", "FREET4" in the last 168 hours.  BNPNo results for input(s): "BNP", "PROBNP" in the last 168 hours.  DDimer No results for input(s): "DDIMER" in the last 168 hours.  Radiology/Studies:  No results found.   Assessment and Plan: Atrial fibrillation   The pt has one other documented spell in 2023  TRansient at that time [facility] denies  symptoms   now     Her CHADSVASc score is 5 (stroke risk 7.2% per year; stroke/TIA/embolism 10%) SHe SHOULD be on anticoagulation BUT her frequent episodes of dizziness/syncope at night are concerning      She is aware of dizziness but continues to go to bathroom and bac I have reviewed risks with her but she needs to make some modifications in nighttime bathroom routine to make it safe  Recomm Adjust nighttime bathroom habits (bedside commode possibly). Will set up for echo to evaluate anatomy, function of heart  Will set up for 7 day Zio patch to see if in/out of afib and what rates are  Check TSH today   She says it has been off  2  HTN   Pt will need to be followed as outpt  3 GI   s/p colonscopy with Bx of polyps      No anticoagulation until changes made     Will make appt in clinic ASAP to review   For questions or updates, please contact [facility] Please consult [email or web address] for contact info under    Signed, Ola Berger, MD  10/17/2023 10:33 AM

## 2023-10-17 NOTE — Telephone Encounter (Signed)
-----   Message from [place] sent at 10/17/2023 11:43 AM EDT ----- Regarding: Echo and 7-day Zio patch Good afternoon,   This patient needs a 7-day Zio patch and echocardiogram per Dr. Avanell Bob for atrial fibrillation. I already scheduled her follow-up in 11/2023.  Thanks,  [place]

## 2023-10-17 NOTE — H&P (Signed)
 Brandi Neal is an 78 y.o. female.   Chief Complaint: Rectal bleeding HPI: Brandi Neal is a 78 y.o. 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   States that she is still having intermittent rectal bleeding but no rectal discomfort.  No nausea, vomiting, fever melena, abdominal pain or abdominal distention.  Past Medical History:  Diagnosis Date   Chronic constipation    Depression    Diarrhea    Hyperlipemia    Hypothyroidism    Post-menopausal    Rectal cancer (HCC)    Small bowel obstruction due to adhesions [facility])    UTI (urinary tract infection)     Past Surgical History:  Procedure Laterality Date   APPENDECTOMY     BACK SURGERY     2 back surgeries : 1986, 1994 Patient states that she has the Simmon Plated   BIOPSY  10/19/2021   Procedure: BIOPSY;  Surgeon: Vinetta Greening, DO;  [facility];  Service: Endoscopy;;   CESAREAN SECTION     Patient had 3 C-Sections   COLON SURGERY  2007   reversal of ileostomy. complicated by infection requiring healing from inside/out   COLONOSCOPY  12/18/2012   Pandya: internal hemorrhoids   COLONOSCOPY WITH PROPOFOL  N/A 10/19/2021   Procedure: COLONOSCOPY WITH PROPOFOL ;  Surgeon: Vinetta Greening, DO;  [facility];  Service: Endoscopy;  Laterality: N/A;   Hysterctomy  05/20/1992   POLYPECTOMY  10/19/2021   Procedure: POLYPECTOMY;  Surgeon: Vinetta Greening, DO;  [facility];  Service: Endoscopy;;   RECTAL SURGERY  05/20/2005   low anterior resection with creation of J-pouch and loop ileostomy. For rectal polyp with invasive adenocarcinoma   THYROIDECTOMY   ?1983   partial-   TUBAL LIGATION  05/21/1967    Family History  Problem Relation Age of Onset   Kidney failure Mother    Lung cancer Father    Colon cancer Sister    Arthritis Sister     Diabetes Sister    Diabetes Brother    Crohn's disease Son    Healthy Son    Diabetes Daughter    Heart attack Daughter    Social History:  reports that she has never smoked. She has never been exposed to tobacco smoke. She has never used smokeless tobacco. She reports current alcohol use. She reports that she does not use drugs.  Allergies: No Known Allergies  Medications Prior to Admission  Medication Sig Dispense Refill   amLODipine (NORVASC) 5 MG tablet Take 5 mg by mouth daily.     Calcium Carb-Cholecalciferol (CALCIUM/VITAMIN D PO) Take 600 mg by mouth daily at 6 (six) AM.     diphenhydrAMINE (BENADRYL) 25 mg capsule Take 25 mg by mouth every 6 (six) hours as needed.     levothyroxine (SYNTHROID) 100 MCG tablet Take 100 mcg by mouth daily.     Multiple Vitamin (MULTIVITAMIN) tablet Take 1 tablet by mouth daily.     olmesartan (BENICAR) 40 MG tablet Take 40 mg by mouth daily.     OVER THE COUNTER MEDICATION Laxative as needed     psyllium (METAMUCIL) 58.6 % packet Take 1 packet by mouth daily. One spoonful daily     simvastatin (ZOCOR) 20 MG tablet Take 20 mg by mouth at bedtime.     traMADol  (ULTRAM ) 50 MG tablet Take 50 mg  by mouth 3 (three) times daily.      No results found for this or any previous visit (from the past 48 hours). No results found.  Review of Systems  Gastrointestinal:  Positive for blood in stool.  All other systems reviewed and are negative.   Blood pressure (!) 163/64, pulse 72, temperature 97.9 F (36.6 C), temperature source Oral, resp. rate 14, height 5\' 5"  (1.651 m), weight 68 kg, SpO2 100%. Physical Exam  GENERAL: The patient is AO x3, in no acute distress. HEENT: Head is normocephalic and atraumatic. EOMI are intact. Mouth is well hydrated and without lesions. NECK: Supple. No masses LUNGS: Clear to auscultation. No presence of rhonchi/wheezing/rales. Adequate chest expansion HEART: RRR, normal s1 and s2. ABDOMEN: Soft, nontender, no  guarding, no peritoneal signs, and nondistended. BS +. No masses. EXTREMITIES: Without any cyanosis, clubbing, rash, lesions or edema. NEUROLOGIC: AOx3, no focal motor deficit. SKIN: no jaundice, no rashes  Assessment/Plan Brandi Neal is a 78 y.o. 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  Will proceed with colonoscopy.  Urban Garden, MD 10/17/2023, 8:35 AM

## 2023-10-17 NOTE — Discharge Instructions (Addendum)
 You are being discharged to home.  Liquid diet for 3 days, then resume previous diet.  We are waiting for your pathology results.  Your physician has recommended a repeat colonoscopy in six months for surveillance after today's piecemeal polypectomy if pathology is benign. Augmentin  BId for 5 days.  Do not take any aspirin, ibuprofen (including Advil, Motrin or Nuprin), naproxen (including Aleve), or any other non-steroidal anti-inflammatory drugs for 10 days after your polyp removal.  No aspirin, ibuprofen, naproxen, or other non-steroidal anti-inflammatory drugs for 10 days after polyp removal.   Follow up with [facility].  Office will call with an appointmtnt

## 2023-10-17 NOTE — Anesthesia Postprocedure Evaluation (Signed)
 Anesthesia Post Note  Patient: Brandi Neal  Procedure(s) Performed: COLONOSCOPY  Patient location during evaluation: Short Stay Anesthesia Type: General Level of consciousness: awake and alert Pain management: pain level controlled Vital Signs Assessment: post-procedure vital signs reviewed and stable Respiratory status: spontaneous breathing Cardiovascular status: stable Postop Assessment: no apparent nausea or vomiting Anesthetic complications: no   No notable events documented.   Last Vitals:  Vitals:   10/17/23 0804 10/17/23 0956  BP: (!) 163/64 (!) 121/59  Pulse: 72 85  Resp: 14 16  Temp: 36.6 C 36.4 C  SpO2: 100% 100%    Last Pain:  Vitals:   10/17/23 0956  TempSrc: Oral  PainSc: 6                  Lavender Stanke

## 2023-10-17 NOTE — Progress Notes (Signed)
 Pt had new onset afib during procedure, confirmed with 12 lead EKG. Will reach cardiology to evaluate patient. Discussed with anesthesia.

## 2023-10-17 NOTE — Op Note (Addendum)
 [facility] Patient Name: Brandi Neal Procedure Date: 10/17/2023 8:34 AM MRN: [id number] Date of Birth: Mar 19, 1946 Attending MD: [name] , , [id number] CSN: [id number] Age: 78 Admit Type: Outpatient Procedure:                Colonoscopy Indications:              Rectal bleeding Providers:                [name], Graydon Lazier RN, RN, Theola Fitch Referring MD:              Medicines:                Monitored Anesthesia Care Complications:            No immediate complications. Estimated Blood Loss:     Estimated blood loss: none. Procedure:                Pre-Anesthesia Assessment:                           - Prior to the procedure, a History and Physical                            was performed, and patient medications, allergies                            and sensitivities were reviewed. The patient's                            tolerance of previous anesthesia was reviewed.                           - The risks and benefits of the procedure and the                            sedation options and risks were discussed with the                            patient. All questions were answered and informed                            consent was obtained.                           - ASA Grade Assessment: III - A patient with severe                            systemic disease.                           After obtaining informed consent, the colonoscope                            was passed under direct vision. Throughout the                            procedure, the patient's blood pressure, pulse,  and                            oxygen saturations were monitored continuously. The                            [id number]-HQ190L ([id number]) scope was introduced through                            the anus and advanced to the the terminal ileum.                            The colonoscopy was performed without difficulty.                            The patient tolerated the  procedure well. The                            quality of the bowel preparation was good. Scope In: 9:01:46 AM Scope Out: 9:44:28 AM Scope Withdrawal Time: 0 hours 36 minutes 3 seconds  Total Procedure Duration: 0 hours 42 minutes 42 seconds  Findings:      The perianal and digital rectal examinations were normal.      The terminal ileum appeared normal.      A 6 mm polyp was found in the transverse colon. The polyp was sessile.       The polyp was removed with a cold snare. Resection and retrieval were       complete.      An 8 mm polyp was found in the sigmoid colon - located in the efferent       limb proximal to the site of surgical anastomosis. The polyp was       sessile. The polyp was removed with a cold snare. Resection and       retrieval were complete.      An 18 to 20 mm polyp was found in the mid rectum. The polyp was       semi-sessile and Paris classification mixed IIa + IIc (elevation with a       small central depression). This polyp was overlying the previous       surgical scar. Area was successfully injected with 8 mL Eleview for a       lift polypectomy. Area was also subsequently successfully injected with       3.5 mL of a 1:100,000 mg/mL solution of epinephrine for a lift       polypectomy. Overall, there was significan resistance when injection was       performed, which was concerning for underlying scarring. Imaging was       performed using white light and narrow band imaging to visualize the       mucosa and demarcate the polyp site after injection for EMR purposes.       The polyp was removed with a piecemeal technique using a hot snare.       Resection and retrieval were complete. After completing the polypectomy,       the edges of the polypectomy site were ablated with the tip of the       snare, soft coagulation settings. Upon completion of the polypectomy,  the area was covered with a layer of Purastat to prevent bleeding.      Scattered  small-mouthed diverticula were found in the sigmoid colon and       descending colon.      There was evidence of a prior end-to-end colo-colonic anastomosis in the       mid rectum. This was patent. The anastomosis was traversed.      Non-bleeding internal hemorrhoids were found during retroflexion. The       hemorrhoids were small. Impression:               - The examined portion of the ileum was normal.                           - One 6 mm polyp in the transverse colon, removed                            with a cold snare. Resected and retrieved.                           - One 8 mm polyp in the sigmoid colon, removed with                            a cold snare. Resected and retrieved.                           - One 18 to 20 mm polyp in the mid rectum, removed                            piecemeal EMR using a hot snare. Resected and                            retrieved.                           - Diverticulosis in the sigmoid colon and in the                            descending colon.                           - Patent end-to-end colo-colonic anastomosis.                           - Non-bleeding internal hemorrhoids. Moderate Sedation:      Per Anesthesia Care Recommendation:           - Discharge patient to home (ambulatory).                           - Liquid diet for 3 days, then resume previous diet.                           - Await pathology results.                           - Repeat  colonoscopy in 6 months for surveillance                            after piecemeal polypectomy if pathology is benign.                           - Augmentin  BId for 5 days.                           - No aspirin, ibuprofen, naproxen, or other                            non-steroidal anti-inflammatory drugs for 10 days                            after polyp removal. Procedure Code(s):        --- Professional ---                           [phone], 59, Colonoscopy, flexible; with removal of                             tumor(s), polyp(s), or other lesion(s) by snare                            technique                           45381, Colonoscopy, flexible; with directed                            submucosal injection(s), any substance Diagnosis Code(s):        --- Professional ---                           K64.8, Other hemorrhoids                           D12.3, Benign neoplasm of transverse colon (hepatic                            flexure or splenic flexure)                           D12.5, Benign neoplasm of sigmoid colon                           D12.8, Benign neoplasm of rectum                           Z98.0, Intestinal bypass and anastomosis status                           K62.5, Hemorrhage of anus and rectum                           K57.30, Diverticulosis of large  intestine without                            perforation or abscess without bleeding CPT copyright 2022 American Medical Association. All rights reserved. The codes documented in this report are preliminary and upon coder review may  be revised to meet current compliance requirements. [name], MD [name],  10/17/2023 10:03:23 AM This report has been signed electronically. Number of Addenda: 0

## 2023-10-17 NOTE — Transfer of Care (Addendum)
 Immediate Anesthesia Transfer of Care Note  Patient: Brandi Neal  Procedure(s) Performed: COLONOSCOPY  Patient Location: Short Stay  Anesthesia Type:General  Level of Consciousness: awake  Airway & Oxygen Therapy: Patient Spontanous Breathing  Post-op Assessment: Report given to RN  Post vital signs: Reviewed  Last Vitals:  Vitals Value Taken Time  BP 121/59   Temp 36.4   Pulse 85   Resp 16   SpO2 100     Last Pain:  Vitals:   10/17/23 0855  TempSrc:   PainSc: 7       Patients Stated Pain Goal: 7 (10/17/23 0804)  Complications: No notable events documented.Obtaining 12 Lead EKG for questionable Afib/flutter.

## 2023-10-17 NOTE — Anesthesia Preprocedure Evaluation (Signed)
 Anesthesia Evaluation  Patient identified by MRN, date of birth, ID band Patient awake    Reviewed: Allergy & Precautions, H&P , NPO status , Patient's Chart, lab work & pertinent test results, reviewed documented beta blocker date and time   Airway Mallampati: II  TM Distance: >3 FB Neck ROM: full    Dental no notable dental hx.    Pulmonary neg pulmonary ROS   Pulmonary exam normal breath sounds clear to auscultation       Cardiovascular Exercise Tolerance: Good hypertension, negative cardio ROS  Rhythm:regular Rate:Normal     Neuro/Psych  PSYCHIATRIC DISORDERS  Depression    negative neurological ROS  negative psych ROS   GI/Hepatic negative GI ROS, Neg liver ROS,,,  Endo/Other  negative endocrine ROSHypothyroidism    Renal/GU Renal diseasenegative Renal ROS  negative genitourinary   Musculoskeletal   Abdominal   Peds  Hematology negative hematology ROS (+)   Anesthesia Other Findings   Reproductive/Obstetrics negative OB ROS                             Anesthesia Physical Anesthesia Plan  ASA: 2  Anesthesia Plan: General   Post-op Pain Management:    Induction:   PONV Risk Score and Plan: Propofol  infusion  Airway Management Planned:   Additional Equipment:   Intra-op Plan:   Post-operative Plan:   Informed Consent: I have reviewed the patients History and Physical, chart, labs and discussed the procedure including the risks, benefits and alternatives for the proposed anesthesia with the patient or authorized representative who has indicated his/her understanding and acceptance.     Dental Advisory Given  Plan Discussed with: CRNA  Anesthesia Plan Comments:        Anesthesia Quick Evaluation

## 2023-10-17 NOTE — Progress Notes (Signed)
 Patient showing Atrial Fibrillation during colonoscopy procedure.  EKG confirms Atrial Fibrillation.  Patient will follow up with cardiology for treatment.

## 2023-10-20 ENCOUNTER — Ambulatory Visit (INDEPENDENT_AMBULATORY_CARE_PROVIDER_SITE_OTHER): Payer: Self-pay | Admitting: Gastroenterology

## 2023-10-20 ENCOUNTER — Encounter (INDEPENDENT_AMBULATORY_CARE_PROVIDER_SITE_OTHER): Payer: Self-pay | Admitting: *Deleted

## 2023-10-20 ENCOUNTER — Telehealth (INDEPENDENT_AMBULATORY_CARE_PROVIDER_SITE_OTHER): Payer: Self-pay

## 2023-10-20 LAB — SURGICAL PATHOLOGY

## 2023-10-20 NOTE — Telephone Encounter (Signed)
 Patient reported that she started taking her Augmentin  on Friday and has been having diarrhea every 30 to 60 minutes since then.  She is concerned about this.  Not having abdominal pain.  I advised her to stop taking the antibiotics for now.  She will let us  know about her clinical progress on Wednesday morning.  If she is still having significant diarrhea we will need to check for C. difficile and GI pathogen panel.  I also advised her to avoid using antidiarrheals for now.

## 2023-10-20 NOTE — Telephone Encounter (Signed)
 Spoke to patient who stated that she was NOT wearing a zio monitor d/t the fact that she did not own or know how to operate an iPhone/tablet. Advised pt that she did not have to have these devices to wear cardiac monitor. Pt still refused to wear monitor and stated that she will be mailing monitor back today. Pt stated that she is very upset that monitor was ordered by hospital rounder without her consent and to her knowledge she did not have any cardiac issues. Inquired if patient would like to still keep upcoming appointment with provider to which she stated she would keep appointment.   Patient had no further comments or concerns at this time. Will route to provider as FYI.

## 2023-10-20 NOTE — Telephone Encounter (Signed)
 Patient states that she can use the monitor because she don't have a smart phone or a tablet. She doesn't want the monitor. Patient is very upset that it was sent to her without her knowing about it. Please advise

## 2023-10-20 NOTE — Telephone Encounter (Signed)
 Thanks, noted

## 2023-10-20 NOTE — Telephone Encounter (Signed)
 Patient called today says she has had diarrhea ongoing since Tcs on 10/17/2023. She say every time she has to void urine she has a loose stool. She says she is weak and can not eat. She says she has been drinking chocolate ensures daily. Denies any fever, bloody stools,but does have abdominal pain from gas,she has some darker color stools, but thinks due to the chocolate ensures. She uses [facility] [address] in [place], [place]. She would like a call back. [phone].Thanks

## 2023-10-21 ENCOUNTER — Encounter (HOSPITAL_COMMUNITY): Payer: Self-pay | Admitting: Gastroenterology

## 2023-10-22 NOTE — Telephone Encounter (Signed)
 Spoke to patient and explained why she needed a echocardiogram. Patient stated that she was upset with provider for not explaining why she needed testing prior to ordering tests. Patient stated that she will have echo and keep f/u appt with B. Strader, PA.   Patient had no further questions or concerns at this time.

## 2023-10-22 NOTE — Telephone Encounter (Signed)
 Pt would like a c/b she states she doesn't understand anything that's going on. She upset about the Echo and would like to know why its needed. Please advise

## 2023-10-29 ENCOUNTER — Ambulatory Visit (HOSPITAL_COMMUNITY)
Admission: RE | Admit: 2023-10-29 | Discharge: 2023-10-29 | Disposition: A | Discharge status: Home / Self Care | Source: Ambulatory Visit | Attending: Internal Medicine | Admitting: Internal Medicine

## 2023-10-29 ENCOUNTER — Ambulatory Visit: Payer: Self-pay | Admitting: Internal Medicine

## 2023-10-29 DIAGNOSIS — I4891 Unspecified atrial fibrillation: Secondary | ICD-10-CM | POA: Insufficient documentation

## 2023-10-29 LAB — ECHOCARDIOGRAM COMPLETE
AR max vel: 3.04 cm2
AV Area VTI: 3.06 cm2
AV Area mean vel: 3.52 cm2
AV Mean grad: 4 mmHg
AV Peak grad: 9.9 mmHg
Ao pk vel: 1.57 m/s
Area-P 1/2: 3.21 cm2
MV M vel: 5.36 m/s
MV Peak grad: 114.9 mmHg
P 1/2 time: 397 ms
S' Lateral: 2.7 cm

## 2023-10-29 NOTE — Progress Notes (Signed)
*  PRELIMINARY RESULTS* Echocardiogram 2D Echocardiogram has been performed.  Bernis Brisker 10/29/2023, 11:38 AM

## 2023-11-20 ENCOUNTER — Ambulatory Visit: Admitting: Student

## 2024-01-25 IMAGING — CT CT ABD-PELV W/ CM
3 of 5 series · 16 of 46 positions shown, 18 images · IV contrast (Omnipaque or Isovue)
Comparison: CT abdomen and pelvis 03/31/2013

CLINICAL DATA: Left lower quadrant abdominal pain. History of
rectal cancer.

EXAM:
CT ABDOMEN AND PELVIS WITH CONTRAST
TECHNIQUE: Multidetector CT imaging of the abdomen and pelvis was performed
using the standard protocol following bolus administration of
intravenous contrast.

[Series 2: axial st · axial · 0.71mm/px · z∈[-671,-296]mm · 11 of 91 slices shown, 13 images]
[im 8/91  soft-tissue]
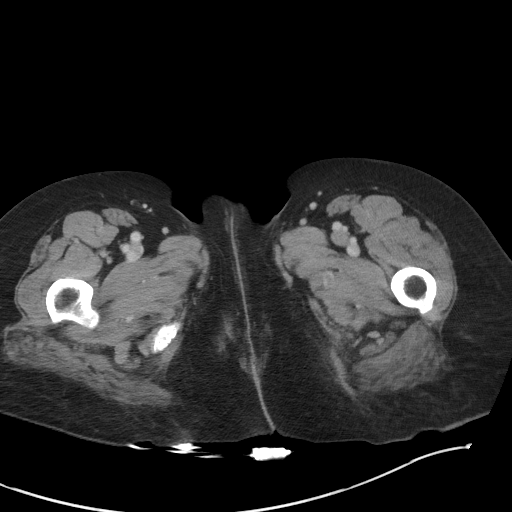
[im 8/91  bone]
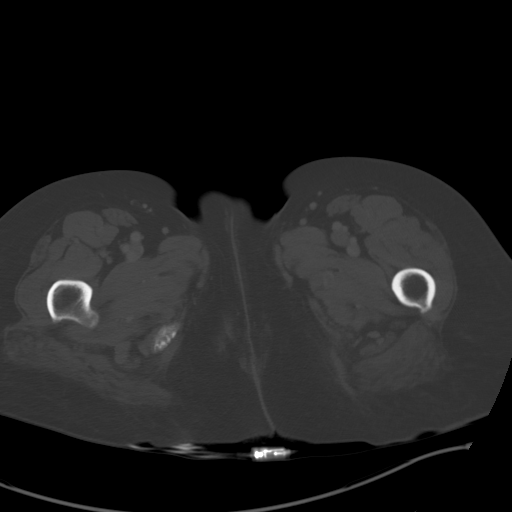
[im 16/91  soft-tissue]
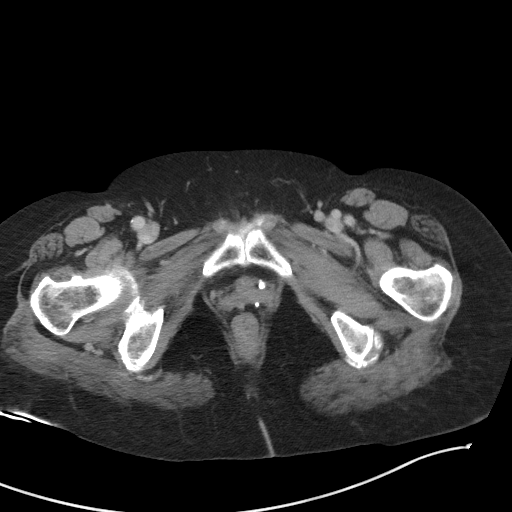
[im 23/91  soft-tissue]
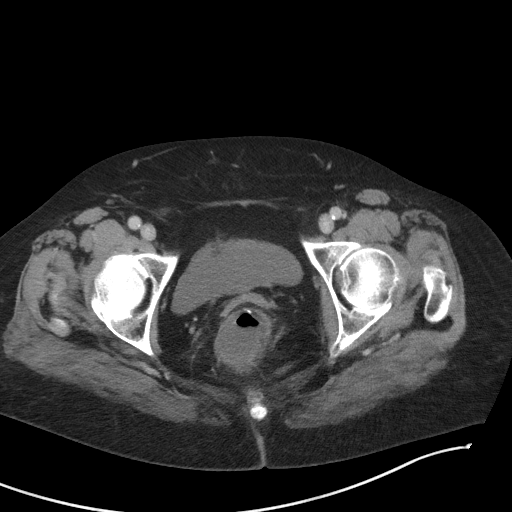
[im 31/91  soft-tissue]
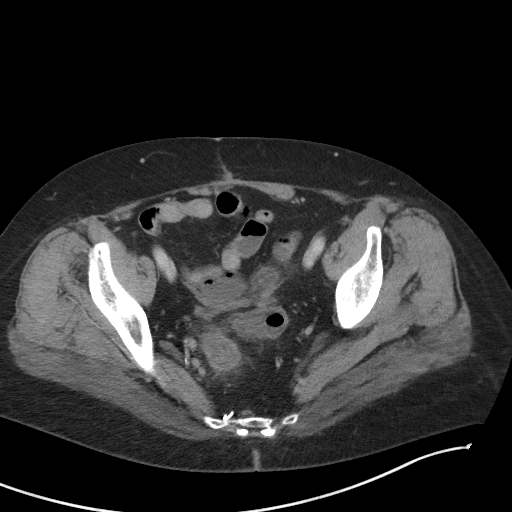
[im 38/91  soft-tissue]
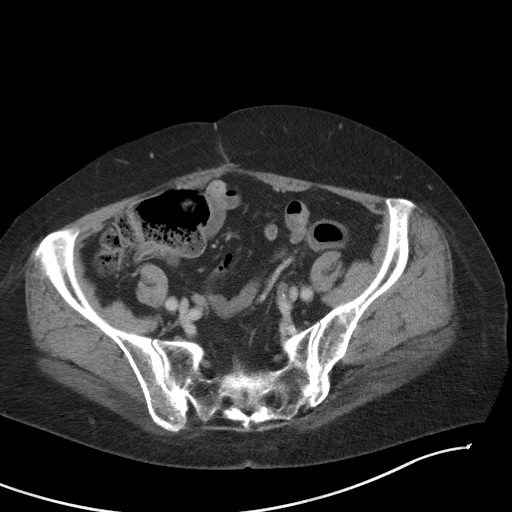
[im 46/91  soft-tissue]
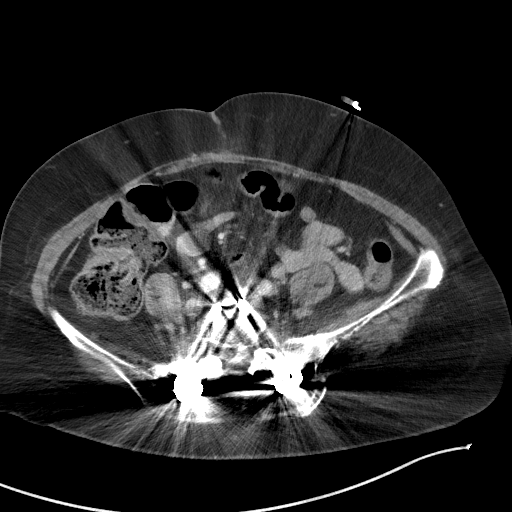
[im 53/91  soft-tissue]
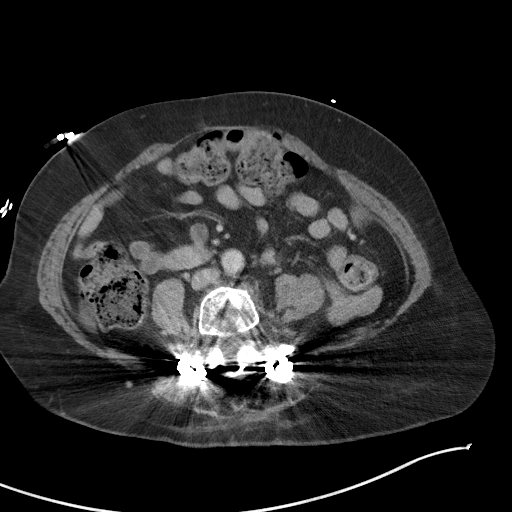
[im 61/91  soft-tissue]
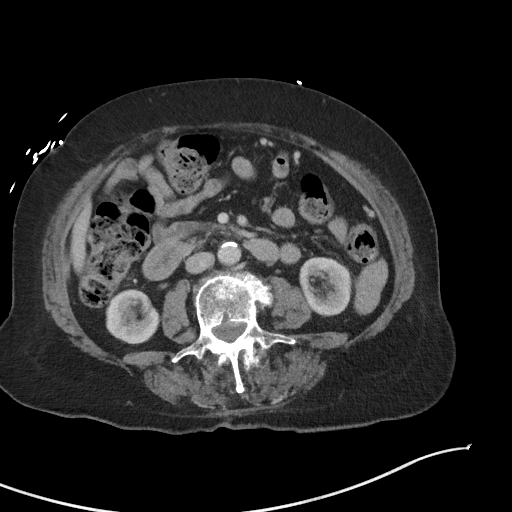
[im 68/91  soft-tissue]
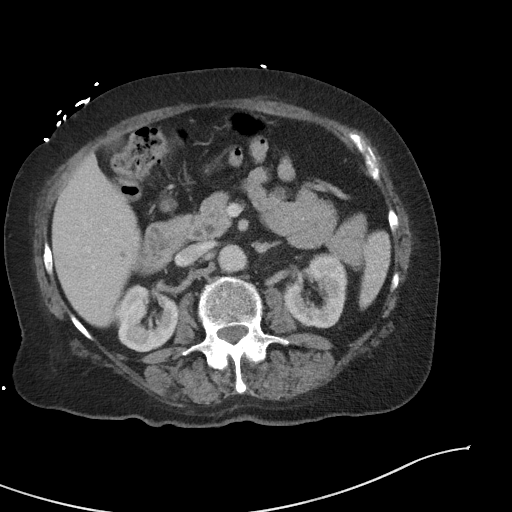
[im 68/91  bone]
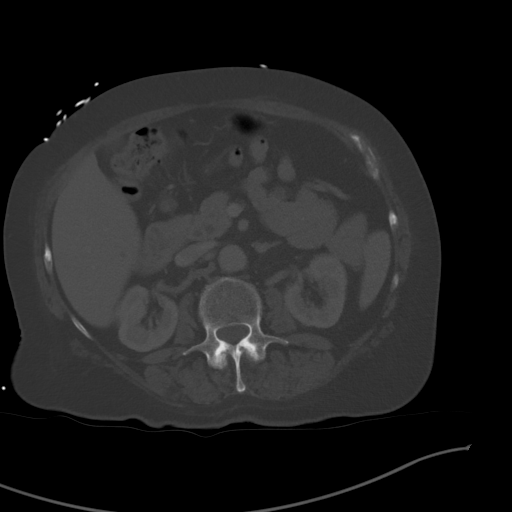
[im 76/91  soft-tissue]
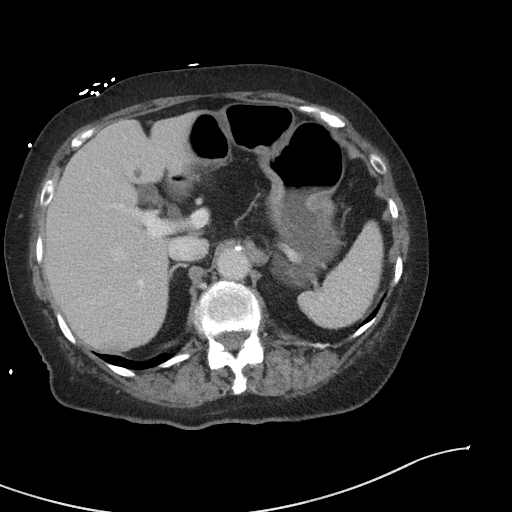
[im 83/91  soft-tissue]
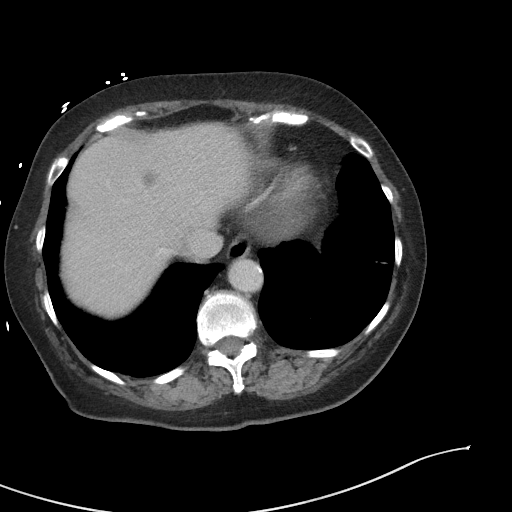

[Series 4: lung bases · axial · 0.71mm/px · z∈[-676,-606]mm · 2 of 91 slices shown]
[im 7/91  bone]
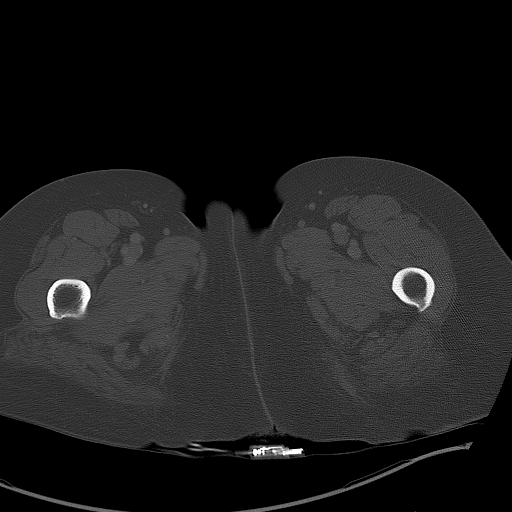
[im 21/91  bone]
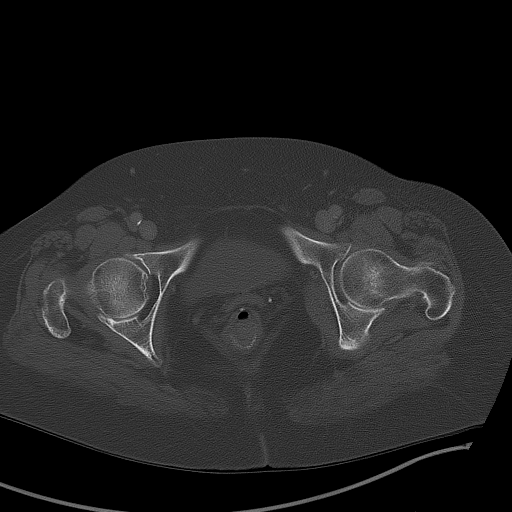

[Series 5: coronal st · coronal · 0.79mm/px · 3 of 89 slices shown]
[im 30/89  soft-tissue]
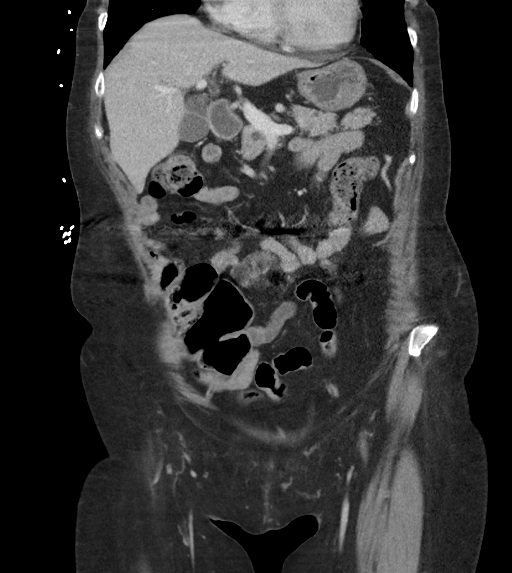
[im 40/89  soft-tissue]
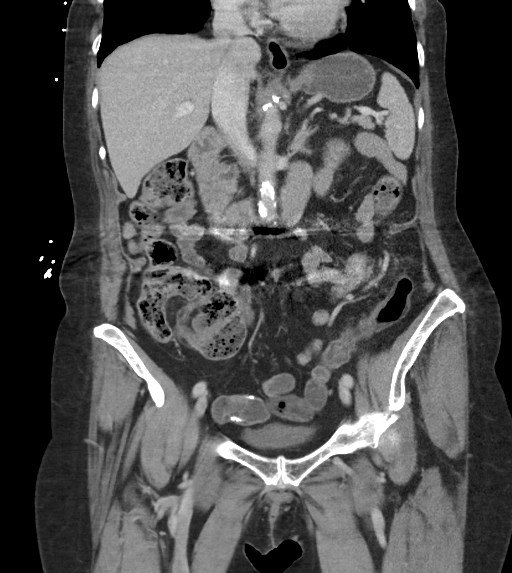
[im 49/89  soft-tissue]
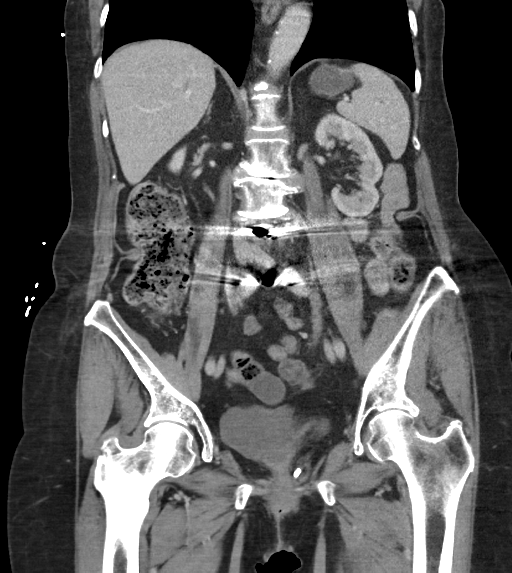

[16 of 46 positions shown; findings below may reference images not displayed]

RADIATION DOSE REDUCTION: This exam was performed according to the
departmental dose-optimization program which includes automated
exposure control, adjustment of the mA and/or kV according to
patient size and/or use of iterative reconstruction technique.

CONTRAST:  75mL OMNIPAQUE IOHEXOL 300 MG/ML  SOLN
FINDINGS: Lower chest: No acute abnormality.

Hepatobiliary: Scattered rounded hypodensities are seen throughout
the liver favored as cysts. The largest measures up to 1.6 cm.
Gallbladder and bile ducts are within normal limits.

Pancreas: There is some new mild pancreatic ductal dilatation
measuring up to 7 mm, indeterminate. No focal pancreatic lesion
identified.

Spleen: Normal in size without focal abnormality.

Adrenals/Urinary Tract: Adrenal glands are unremarkable. Kidneys are
normal, without renal calculi, focal lesion, or hydronephrosis.
Bladder is unremarkable.

Stomach/Bowel: There surgical changes in the stomach and small
bowel. Rectosigmoid anastomosis is present. There is rectosigmoid
colon wall thickening with surrounding inflammatory stranding. No
evidence for perforation or abscess. No bowel obstruction. Appendix
is not seen. Remaining colon, small bowel and stomach are within
normal limits.

Vascular/Lymphatic: Aortic atherosclerosis. No enlarged abdominal or
pelvic lymph nodes.

Reproductive: Status post hysterectomy. No adnexal masses.

Other: There is some bulging of the central abdominal wall. No
abdominopelvic ascites. Midline abdominal scar is present.

Musculoskeletal: Multilevel degenerative changes affect the spine.
L4-S1 posterior fusion hardware is present.
IMPRESSION: 1. Rectosigmoid anastomosis is present. There is rectosigmoid colon
wall thickening with surrounding inflammatory stranding compatible
with proctocolitis. No bowel obstruction, free air or abscess.
2. New pancreatic ductal dilatation of uncertain etiology. Focal
pancreatic lesion is not visualized, but can not be excluded. This
can be followed up with dedicated pancreatic MRI.

## 2024-03-03 ENCOUNTER — Encounter (INDEPENDENT_AMBULATORY_CARE_PROVIDER_SITE_OTHER): Payer: Self-pay | Admitting: Gastroenterology

## 2024-03-04 ENCOUNTER — Encounter (INDEPENDENT_AMBULATORY_CARE_PROVIDER_SITE_OTHER): Payer: Self-pay | Admitting: *Deleted
